# Patient Record
Sex: Female | Born: 1970 | Race: Black or African American | Hispanic: No | Marital: Single | Smoking: Never smoker
Health system: Southern US, Community
[De-identification: ages and names within clinical notes are randomized; demographics above are authoritative.]

## PROBLEM LIST (undated history)

## (undated) ENCOUNTER — Inpatient Hospital Stay (HOSPITAL_COMMUNITY): Payer: Self-pay

## (undated) DIAGNOSIS — R87629 Unspecified abnormal cytological findings in specimens from vagina: Secondary | ICD-10-CM

## (undated) DIAGNOSIS — I10 Essential (primary) hypertension: Secondary | ICD-10-CM

## (undated) DIAGNOSIS — Z9851 Tubal ligation status: Secondary | ICD-10-CM

## (undated) DIAGNOSIS — E0590 Thyrotoxicosis, unspecified without thyrotoxic crisis or storm: Secondary | ICD-10-CM

## (undated) DIAGNOSIS — Z8619 Personal history of other infectious and parasitic diseases: Secondary | ICD-10-CM

## (undated) DIAGNOSIS — B977 Papillomavirus as the cause of diseases classified elsewhere: Secondary | ICD-10-CM

## (undated) HISTORY — PX: HERNIA REPAIR: SHX51

## (undated) HISTORY — DX: Unspecified abnormal cytological findings in specimens from vagina: R87.629

## (undated) HISTORY — DX: Personal history of other infectious and parasitic diseases: Z86.19

## (undated) HISTORY — PX: ABDOMINAL SURGERY: SHX537

## (undated) HISTORY — PX: GYNECOLOGIC CRYOSURGERY: SHX857

---
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
## 1999-03-25 ENCOUNTER — Ambulatory Visit (HOSPITAL_BASED_OUTPATIENT_CLINIC_OR_DEPARTMENT_OTHER): Admission: RE | Admit: 1999-03-25 | Discharge: 1999-03-25 | Payer: Self-pay | Admitting: Orthopedic Surgery

## 1999-09-02 ENCOUNTER — Inpatient Hospital Stay (HOSPITAL_COMMUNITY): Admission: AD | Admit: 1999-09-02 | Discharge: 1999-09-02 | Payer: Self-pay | Admitting: *Deleted

## 2002-01-04 DIAGNOSIS — Z8619 Personal history of other infectious and parasitic diseases: Secondary | ICD-10-CM

## 2002-01-04 HISTORY — DX: Personal history of other infectious and parasitic diseases: Z86.19

## 2002-02-09 ENCOUNTER — Inpatient Hospital Stay (HOSPITAL_COMMUNITY): Admission: AD | Admit: 2002-02-09 | Discharge: 2002-02-09 | Payer: Self-pay | Admitting: Obstetrics

## 2002-05-23 ENCOUNTER — Encounter: Admission: RE | Admit: 2002-05-23 | Discharge: 2002-05-23 | Payer: Self-pay | Admitting: Obstetrics

## 2002-06-10 ENCOUNTER — Inpatient Hospital Stay (HOSPITAL_COMMUNITY): Admission: AD | Admit: 2002-06-10 | Discharge: 2002-06-13 | Payer: Self-pay | Admitting: Obstetrics

## 2002-07-10 ENCOUNTER — Encounter: Payer: Self-pay | Admitting: Emergency Medicine

## 2002-07-10 ENCOUNTER — Emergency Department (HOSPITAL_COMMUNITY): Admission: EM | Admit: 2002-07-10 | Discharge: 2002-07-10 | Payer: Self-pay | Admitting: Emergency Medicine

## 2004-10-12 ENCOUNTER — Encounter (HOSPITAL_COMMUNITY): Admission: RE | Admit: 2004-10-12 | Discharge: 2005-01-10 | Payer: Self-pay | Admitting: Internal Medicine

## 2005-08-27 ENCOUNTER — Emergency Department (HOSPITAL_COMMUNITY): Admission: EM | Admit: 2005-08-27 | Discharge: 2005-08-27 | Payer: Self-pay | Admitting: Family Medicine

## 2006-02-01 ENCOUNTER — Ambulatory Visit (HOSPITAL_COMMUNITY): Admission: RE | Admit: 2006-02-01 | Discharge: 2006-02-01 | Payer: Self-pay | Admitting: Obstetrics

## 2006-04-13 ENCOUNTER — Ambulatory Visit (HOSPITAL_COMMUNITY): Admission: RE | Admit: 2006-04-13 | Discharge: 2006-04-13 | Payer: Self-pay | Admitting: Obstetrics

## 2006-06-06 ENCOUNTER — Inpatient Hospital Stay (HOSPITAL_COMMUNITY): Admission: AD | Admit: 2006-06-06 | Discharge: 2006-06-06 | Payer: Self-pay | Admitting: Obstetrics

## 2006-08-05 ENCOUNTER — Ambulatory Visit (HOSPITAL_COMMUNITY): Admission: RE | Admit: 2006-08-05 | Discharge: 2006-08-05 | Payer: Self-pay | Admitting: Obstetrics

## 2006-08-15 ENCOUNTER — Inpatient Hospital Stay (HOSPITAL_COMMUNITY): Admission: AD | Admit: 2006-08-15 | Discharge: 2006-08-15 | Payer: Self-pay | Admitting: Obstetrics

## 2006-08-26 ENCOUNTER — Inpatient Hospital Stay (HOSPITAL_COMMUNITY): Admission: RE | Admit: 2006-08-26 | Discharge: 2006-08-28 | Payer: Self-pay | Admitting: Obstetrics

## 2008-03-15 ENCOUNTER — Emergency Department (HOSPITAL_COMMUNITY): Admission: EM | Admit: 2008-03-15 | Discharge: 2008-03-15 | Payer: Self-pay | Admitting: Family Medicine

## 2008-10-14 ENCOUNTER — Ambulatory Visit (HOSPITAL_COMMUNITY): Admission: RE | Admit: 2008-10-14 | Discharge: 2008-10-14 | Payer: Self-pay | Admitting: Obstetrics

## 2008-10-25 ENCOUNTER — Ambulatory Visit (HOSPITAL_COMMUNITY): Admission: RE | Admit: 2008-10-25 | Discharge: 2008-10-25 | Payer: Self-pay | Admitting: Obstetrics

## 2010-01-25 ENCOUNTER — Encounter: Payer: Self-pay | Admitting: Obstetrics

## 2010-05-22 NOTE — Op Note (Signed)
Chelsea Colon. [facility]  Patient:    Chelsea, Colon                             MRN: [id number] Proc. Date: 03/25/99 Adm. Date:  [id number] Attending:  Milly Jakob                           Operative Report  PREOPERATIVE DIAGNOSIS:  Hammer toes, second and third toe with fairly significant flexion deformity at the distal interphalangeal joint as well.  She does not have significant deformity at the metatarsal phalangeal joint.  POSTOPERATIVE DIAGNOSIS:  Hammer toes, second and third toe with fairly significant flexion deformity at the distal interphalangeal joint as well.  She does not have significant deformity at the metatarsal phalangeal joint.  OPERATION:  Hammer toe correction with distal interphalangeal joint fusion. Tenotomy of the long flexor tendon.  SURGEON:  Harvie Junior, M.D.  ASSISTANT:  Kerby Less, P.A.  ANESTHESIA:  General  BRIEF HISTORY:  She is a 40 year old female with a long history of having had pain in her foot and then down into her second and third toes. She has had conservative treatment for a lengthy period of time with no resolution of her symptoms including anti-inflammatories and shoe modification.  She had had relatives that had this  same procedure and were very satisfied with surgical correction and was desirous of having this done.  Because of this she was taken to the operating room for this  procedure.  DESCRIPTION OF PROCEDURE:  The patient was taken to the operating room and after adequate anesthesia was obtained with a general anesthetic, the patient was placed supine on the operating table.  The right leg was prepped and draped in the usual sterile fashion.  Following Esmarch exsanguination of the extremity, tourniquet was inflated to 250 mmHg.  Following this an incision was made over the PIP of the second toe.  The elliptical incision was made to prepare for the shortening of he toe.  At  this point, the extensor tendon was identified and cut.  The collateral ligaments were cut.  The volar plate was identified and released on the inferior aspect of the toe.  Following this, the attention was turned towards the long flexor tendon.  The flexor brevis was identified and the two limbs were retracted. The flexor longus was then identified below it and this was tenotomized at this  area. This allowed the DIP joint to come up quite nicely.  At this point, attention was turned towards the PIP joint where a rongeur was used to make a parallel resection to expose cancellus bone.  A similar procedure was undertaken over the proximal portion of the middle phalanx.  Once the bony surfaces were denuded, the guide wire from the bioabsorbable pin was put in place and it was measured. The bioabsorbable pin was then put in the proximal phalanx and the distal phalanx was stretched distally and then set back on the pin.  Excellent stability was achieved at the PIP joint.  Fluoroscopy was used to check the placement and was found to be appropriate.  Following this, the wound was copiously irrigated and suctioned dry. A horizontal mattress suture was used repairing the extensor tendon and then the remaining skin was closed with nylon interrupted sutures.  Following this a similar procedure was performed on the third toe.  At this  point, the patient had a sterile compressive dressing applied and she was taken to the recovery room.  She was noted to be in satisfactory condition.  Estimated blood loss for the procedure was none. DD:  03/25/99 TD:  03/26/99 Job: [id number]

## 2010-06-12 ENCOUNTER — Emergency Department (HOSPITAL_COMMUNITY)
Admission: EM | Admit: 2010-06-12 | Discharge: 2010-06-12 | Disposition: A | Discharge status: Home / Self Care | Payer: Worker's Compensation | Attending: Emergency Medicine | Admitting: Emergency Medicine

## 2010-06-12 ENCOUNTER — Emergency Department (HOSPITAL_COMMUNITY): Payer: Worker's Compensation

## 2010-06-12 DIAGNOSIS — R51 Headache: Secondary | ICD-10-CM | POA: Insufficient documentation

## 2010-06-12 DIAGNOSIS — S139XXA Sprain of joints and ligaments of unspecified parts of neck, initial encounter: Secondary | ICD-10-CM | POA: Insufficient documentation

## 2010-06-12 DIAGNOSIS — R209 Unspecified disturbances of skin sensation: Secondary | ICD-10-CM | POA: Insufficient documentation

## 2010-06-12 DIAGNOSIS — Y92410 Unspecified street and highway as the place of occurrence of the external cause: Secondary | ICD-10-CM | POA: Insufficient documentation

## 2010-07-07 ENCOUNTER — Ambulatory Visit: Payer: Self-pay

## 2010-07-07 ENCOUNTER — Other Ambulatory Visit: Payer: Self-pay | Admitting: Occupational Medicine

## 2010-07-07 DIAGNOSIS — R52 Pain, unspecified: Secondary | ICD-10-CM

## 2010-07-09 ENCOUNTER — Other Ambulatory Visit: Payer: Self-pay | Admitting: Occupational Medicine

## 2010-07-09 ENCOUNTER — Ambulatory Visit: Payer: Self-pay

## 2010-07-09 DIAGNOSIS — R52 Pain, unspecified: Secondary | ICD-10-CM

## 2010-10-16 LAB — CBC
HCT: 28.7 — ABNORMAL LOW
HCT: 34.2 — ABNORMAL LOW
Hemoglobin: 11.4 — ABNORMAL LOW
Hemoglobin: 9.7 — ABNORMAL LOW
MCHC: 33.3
MCHC: 33.7
MCV: 78.1
MCV: 79
Platelets: 312
Platelets: 396
RBC: 3.64 — ABNORMAL LOW
RBC: 4.37
RDW: 15.1 — ABNORMAL HIGH
RDW: 15.4 — ABNORMAL HIGH
WBC: 12.6 — ABNORMAL HIGH
WBC: 14.7 — ABNORMAL HIGH

## 2010-10-16 LAB — RPR: RPR Ser Ql: NONREACTIVE

## 2010-10-22 LAB — URINALYSIS, ROUTINE W REFLEX MICROSCOPIC
Bilirubin Urine: NEGATIVE
Glucose, UA: 100 — AB
Ketones, ur: 15 — AB
Leukocytes, UA: NEGATIVE
Nitrite: NEGATIVE
Protein, ur: NEGATIVE
Specific Gravity, Urine: >1.03 — ABNORMAL HIGH
Urobilinogen, UA: 0.2
pH: 6

## 2010-10-22 LAB — URINE MICROSCOPIC-ADD ON

## 2011-03-23 ENCOUNTER — Emergency Department (HOSPITAL_COMMUNITY)
Admission: EM | Admit: 2011-03-23 | Discharge: 2011-03-23 | Disposition: A | Discharge status: Home / Self Care | Payer: BC Managed Care – PPO | Attending: Emergency Medicine | Admitting: Emergency Medicine

## 2011-03-23 ENCOUNTER — Encounter (HOSPITAL_COMMUNITY): Payer: Self-pay | Admitting: Emergency Medicine

## 2011-03-23 DIAGNOSIS — K529 Noninfective gastroenteritis and colitis, unspecified: Secondary | ICD-10-CM

## 2011-03-23 DIAGNOSIS — R000 Tachycardia, unspecified: Secondary | ICD-10-CM | POA: Insufficient documentation

## 2011-03-23 DIAGNOSIS — K5289 Other specified noninfective gastroenteritis and colitis: Secondary | ICD-10-CM | POA: Insufficient documentation

## 2011-03-23 LAB — DIFFERENTIAL
Basophils Absolute: 0 10*3/uL (ref 0.0–0.1)
Basophils Relative: 0 % (ref 0–1)
Eosinophils Absolute: 0 10*3/uL (ref 0.0–0.7)
Eosinophils Relative: 1 % (ref 0–5)
Lymphs Abs: 1.7 10*3/uL (ref 0.7–4.0)
Monocytes Absolute: 0.5 10*3/uL (ref 0.1–1.0)
Monocytes Relative: 13 % — ABNORMAL HIGH (ref 3–12)
Neutro Abs: 2 10*3/uL (ref 1.7–7.7)
Neutrophils Relative %: 46 % (ref 43–77)

## 2011-03-23 LAB — BASIC METABOLIC PANEL
BUN: 14 mg/dL (ref 6–23)
CO2: 27 mEq/L (ref 19–32)
Calcium: 9.8 mg/dL (ref 8.4–10.5)
Chloride: 102 mEq/L (ref 96–112)
Creatinine, Ser: 0.37 mg/dL — ABNORMAL LOW (ref 0.50–1.10)
Glucose, Bld: 98 mg/dL (ref 70–99)
Potassium: 4.1 mEq/L (ref 3.5–5.1)
Sodium: 138 mEq/L (ref 135–145)

## 2011-03-23 LAB — CBC
HCT: 38.9 % (ref 36.0–46.0)
Hemoglobin: 12.5 g/dL (ref 12.0–15.0)
MCH: 24 pg — ABNORMAL LOW (ref 26.0–34.0)
MCHC: 32.1 g/dL (ref 30.0–36.0)
MCV: 74.7 fL — ABNORMAL LOW (ref 78.0–100.0)
RBC: 5.21 MIL/uL — ABNORMAL HIGH (ref 3.87–5.11)
RDW: 12.9 % (ref 11.5–15.5)
WBC: 4.2 10*3/uL (ref 4.0–10.5)

## 2011-03-23 MED ORDER — PROMETHAZINE HCL 25 MG PO TABS: 25.0000 mg | ORAL_TABLET | Freq: Four times a day (QID) | ORAL | Status: AC | PRN

## 2011-03-23 MED ORDER — ONDANSETRON HCL 4 MG/2ML IJ SOLN
4.0000 mg | Freq: Once | INTRAMUSCULAR | Status: AC
  Administered 2011-03-23: 4 mg via INTRAVENOUS
  Filled 2011-03-23: qty 2

## 2011-03-23 MED ORDER — LOPERAMIDE HCL 2 MG PO CAPS: 2.0000 mg | ORAL_CAPSULE | Freq: Four times a day (QID) | ORAL | Status: AC | PRN

## 2011-03-23 MED ORDER — SODIUM CHLORIDE 0.9 % IV BOLUS (SEPSIS)
1000.0000 mL | Freq: Once | INTRAVENOUS | Status: AC
  Administered 2011-03-23: 1000 mL via INTRAVENOUS

## 2011-03-23 NOTE — ED Provider Notes (Signed)
History     CSN: [id number]  Arrival date & time 03/23/11  1223   First MD Initiated Contact with Patient 03/23/11 1525      Chief Complaint  Patient presents with  . Emesis    (Consider location/radiation/quality/duration/timing/severity/associated sxs/prior treatment) Patient is a 41 y.o. female presenting with vomiting. The history is provided by the patient.  Emesis  Associated symptoms include diarrhea. Pertinent negatives include no abdominal pain, no chills, no fever and no headaches.   the patient is a 41 year old, female, who presents to the emergency department complaining of full, nausea, vomiting, diarrhea.  He stopped and antibiotics for a sinus infection.  She denies pain anywhere.  He denies fevers, chills and she says that there is a small amount of blood "in her emesis.  She has not been exposed to anybody with similar symptoms.  She denies a history of peptic ulcer disease.  She was seen by her family doctor, who noted that she had pulsatile mass, and so she was sent here for further evaluation.  History reviewed. No pertinent past medical history.  Past Surgical History  Procedure Date  . Cesarean section   . Abdominal surgery     No family history on file.  History  Substance Use Topics  . Smoking status: Never Smoker   . Smokeless tobacco: Not on file  . Alcohol Use: No    OB History    Grav Para Term Preterm Abortions TAB SAB Ect Mult Living                  Review of Systems  Constitutional: Negative for fever and chills.  Cardiovascular: Negative for chest pain.  Gastrointestinal: Positive for nausea, vomiting and diarrhea. Negative for abdominal pain and blood in stool.  Genitourinary: Negative for dysuria and hematuria.  Neurological: Negative for headaches.  Psychiatric/Behavioral: Negative for confusion.  All other systems reviewed and are negative.    Allergies  Review of patient's allergies indicates no known allergies.  Home  Medications   Current Outpatient Rx  Name Route Sig Dispense Refill  . IBUPROFEN 200 MG PO TABS Oral Take 400-800 mg by mouth every 6 (six) hours as needed. For pain and headache      BP 136/73  Pulse 108  Temp(Src) 98 F (36.7 C) (Oral)  Resp 20  SpO2 99%  Physical Exam  Vitals reviewed. Constitutional: She is oriented to person, place, and time. She appears well-developed and well-nourished.  HENT:  Head: Normocephalic and atraumatic.  Eyes: Pupils are equal, round, and reactive to light.  Neck: Normal range of motion.  Cardiovascular: Regular rhythm and normal heart sounds.   No murmur heard.      Tachycardia  Pulmonary/Chest: Effort normal and breath sounds normal. No respiratory distress. She has no wheezes. She has no rales.  Abdominal: Soft. She exhibits no distension and no mass. There is tenderness. There is no rebound and no guarding.       Pulsatile Mass in abdomen.  No bruit  Musculoskeletal: Normal range of motion. She exhibits no edema and no tenderness.  Neurological: She is alert and oriented to person, place, and time. No cranial nerve deficit.  Skin: Skin is warm and dry. No rash noted. No erythema.  Psychiatric: She has a normal mood and affect. Her behavior is normal.    ED Course  Procedures (including critical care time) 41 year old, female, with history of gastroenteritis symptoms, presents with tachycardia, to 2, uncontrolled emesis, diarrhea.  She does  have a tender palpable mass, which is pulsatile in her abdomen.  Bedside ultrasound was performed.  Aorta is less  than 2 cm at its widest diameter .  The proximal abdominal aorta.  All the way to the bifurcation.  We'll establish an IV and treat her symptomatically and perform laboratory testing for further evaluation to   Labs Reviewed  CBC  DIFFERENTIAL  BASIC METABOLIC PANEL   No results found.   No diagnosis found.  4:47 PM I L IVF completed. She wants to go home.   MDM  Gastroenteritis  resulting in tachycardia Tachycardia resolved        Cheri Guppy, MD 03/23/11 [phone]

## 2011-03-23 NOTE — ED Notes (Signed)
Has had a cold and then started to vomit saw her dr today and told him this has been sent for further etsts

## 2011-03-23 NOTE — Discharge Instructions (Signed)
Your blood tests do not show any significant illness.  Use Phenergan for nausea and vomiting, and Imodium for diarrhea.  Followup with your Dr. as needed.  He can tell him that an ultrasound of your aorta shows that he is not dilated.  Return for worse or uncontrolled symptoms

## 2011-05-20 ENCOUNTER — Other Ambulatory Visit: Payer: Self-pay | Admitting: Internal Medicine

## 2011-05-20 ENCOUNTER — Other Ambulatory Visit: Payer: Self-pay | Admitting: Obstetrics

## 2011-05-20 DIAGNOSIS — Z1231 Encounter for screening mammogram for malignant neoplasm of breast: Secondary | ICD-10-CM

## 2011-06-04 ENCOUNTER — Ambulatory Visit: Payer: Self-pay

## 2012-05-09 ENCOUNTER — Ambulatory Visit: Payer: Self-pay

## 2012-05-11 ENCOUNTER — Ambulatory Visit
Admission: RE | Admit: 2012-05-11 | Discharge: 2012-05-11 | Disposition: A | Discharge status: Home / Self Care | Payer: BC Managed Care – PPO | Source: Ambulatory Visit | Attending: Internal Medicine | Admitting: Internal Medicine

## 2012-05-11 DIAGNOSIS — Z1231 Encounter for screening mammogram for malignant neoplasm of breast: Secondary | ICD-10-CM

## 2013-09-26 ENCOUNTER — Other Ambulatory Visit: Payer: Self-pay

## 2013-09-26 DIAGNOSIS — Z1231 Encounter for screening mammogram for malignant neoplasm of breast: Secondary | ICD-10-CM

## 2013-10-05 ENCOUNTER — Ambulatory Visit: Payer: Self-pay

## 2013-10-12 ENCOUNTER — Ambulatory Visit: Payer: Self-pay

## 2013-10-18 ENCOUNTER — Ambulatory Visit: Payer: Self-pay

## 2013-11-05 ENCOUNTER — Ambulatory Visit
Admission: RE | Admit: 2013-11-05 | Discharge: 2013-11-05 | Disposition: A | Discharge status: Home / Self Care | Payer: BC Managed Care – PPO | Source: Ambulatory Visit

## 2013-11-05 DIAGNOSIS — Z1231 Encounter for screening mammogram for malignant neoplasm of breast: Secondary | ICD-10-CM

## 2014-01-04 NOTE — L&D Delivery Note (Signed)
Delivery Note At 2:13 AM a viable female was delivered via  (Presentation: ;  ).  APGAR: , ; weight  .   Placenta status: , .  Cord:  with the following complications: .  Cord pH: not done  Anesthesia: Epidural  Episiotomy:   Lacerations:   Suture Repair: 2.0 Est. Blood Loss (mL):    Mom to postpartum.  Baby to Couplet care / Skin to Skin.  Satoria Dunlop A 11/23/2014, 2:36 AM

## 2014-04-24 ENCOUNTER — Ambulatory Visit (HOSPITAL_COMMUNITY)
Admission: RE | Admit: 2014-04-24 | Discharge: 2014-04-24 | Disposition: A | Discharge status: Home / Self Care | Payer: BC Managed Care – PPO | Source: Ambulatory Visit | Attending: Obstetrics | Admitting: Obstetrics

## 2014-04-24 ENCOUNTER — Other Ambulatory Visit (HOSPITAL_COMMUNITY): Payer: Self-pay | Admitting: Obstetrics

## 2014-04-24 DIAGNOSIS — Z3689 Encounter for other specified antenatal screening: Secondary | ICD-10-CM

## 2014-04-24 DIAGNOSIS — O26851 Spotting complicating pregnancy, first trimester: Secondary | ICD-10-CM | POA: Diagnosis not present

## 2014-04-24 DIAGNOSIS — Z3401 Encounter for supervision of normal first pregnancy, first trimester: Secondary | ICD-10-CM

## 2014-04-24 DIAGNOSIS — Z3A08 8 weeks gestation of pregnancy: Secondary | ICD-10-CM | POA: Insufficient documentation

## 2014-04-24 DIAGNOSIS — Z36 Encounter for antenatal screening of mother: Secondary | ICD-10-CM | POA: Insufficient documentation

## 2014-04-24 LAB — OB RESULTS CONSOLE RPR: RPR: NONREACTIVE

## 2014-04-24 LAB — OB RESULTS CONSOLE HIV ANTIBODY (ROUTINE TESTING): HIV: NONREACTIVE

## 2014-04-24 LAB — OB RESULTS CONSOLE RUBELLA ANTIBODY, IGM: RUBELLA: NON-IMMUNE/NOT IMMUNE

## 2014-04-24 LAB — OB RESULTS CONSOLE ABO/RH: RH TYPE: POSITIVE

## 2014-04-24 LAB — OB RESULTS CONSOLE HEPATITIS B SURFACE ANTIGEN: Hepatitis B Surface Ag: NEGATIVE

## 2014-04-24 LAB — OB RESULTS CONSOLE ANTIBODY SCREEN: Antibody Screen: NEGATIVE

## 2014-06-25 ENCOUNTER — Other Ambulatory Visit (HOSPITAL_COMMUNITY): Payer: Self-pay | Admitting: Obstetrics

## 2014-06-25 DIAGNOSIS — Z0489 Encounter for examination and observation for other specified reasons: Secondary | ICD-10-CM

## 2014-06-25 DIAGNOSIS — IMO0002 Reserved for concepts with insufficient information to code with codable children: Secondary | ICD-10-CM

## 2014-06-25 DIAGNOSIS — O09522 Supervision of elderly multigravida, second trimester: Secondary | ICD-10-CM

## 2014-07-05 ENCOUNTER — Ambulatory Visit (HOSPITAL_COMMUNITY)
Admission: RE | Admit: 2014-07-05 | Discharge: 2014-07-05 | Disposition: A | Discharge status: Home / Self Care | Payer: BC Managed Care – PPO | Source: Ambulatory Visit | Attending: Obstetrics | Admitting: Obstetrics

## 2014-07-05 ENCOUNTER — Encounter (HOSPITAL_COMMUNITY): Payer: Self-pay

## 2014-07-05 ENCOUNTER — Other Ambulatory Visit (HOSPITAL_COMMUNITY): Payer: Self-pay | Admitting: Obstetrics

## 2014-07-05 DIAGNOSIS — O283 Abnormal ultrasonic finding on antenatal screening of mother: Secondary | ICD-10-CM | POA: Insufficient documentation

## 2014-07-05 DIAGNOSIS — O3421 Maternal care for scar from previous cesarean delivery: Secondary | ICD-10-CM | POA: Diagnosis not present

## 2014-07-05 DIAGNOSIS — O09522 Supervision of elderly multigravida, second trimester: Secondary | ICD-10-CM | POA: Diagnosis not present

## 2014-07-05 DIAGNOSIS — Z3A18 18 weeks gestation of pregnancy: Secondary | ICD-10-CM | POA: Insufficient documentation

## 2014-07-05 DIAGNOSIS — Z3689 Encounter for other specified antenatal screening: Secondary | ICD-10-CM | POA: Insufficient documentation

## 2014-07-05 DIAGNOSIS — Z0489 Encounter for examination and observation for other specified reasons: Secondary | ICD-10-CM

## 2014-07-05 DIAGNOSIS — O99282 Endocrine, nutritional and metabolic diseases complicating pregnancy, second trimester: Secondary | ICD-10-CM | POA: Insufficient documentation

## 2014-07-05 DIAGNOSIS — Z36 Encounter for antenatal screening of mother: Secondary | ICD-10-CM | POA: Diagnosis present

## 2014-07-05 DIAGNOSIS — O10912 Unspecified pre-existing hypertension complicating pregnancy, second trimester: Secondary | ICD-10-CM | POA: Diagnosis not present

## 2014-07-05 DIAGNOSIS — E0590 Thyrotoxicosis, unspecified without thyrotoxic crisis or storm: Secondary | ICD-10-CM | POA: Insufficient documentation

## 2014-07-05 DIAGNOSIS — IMO0002 Reserved for concepts with insufficient information to code with codable children: Secondary | ICD-10-CM

## 2014-07-05 DIAGNOSIS — O09529 Supervision of elderly multigravida, unspecified trimester: Secondary | ICD-10-CM | POA: Insufficient documentation

## 2014-07-05 HISTORY — DX: Thyrotoxicosis, unspecified without thyrotoxic crisis or storm: E05.90

## 2014-07-05 HISTORY — DX: Essential (primary) hypertension: I10

## 2014-07-09 NOTE — Progress Notes (Signed)
Genetic Counseling  High-Risk Gestation Note  Appointment Date:  07/05/14 Referred By: Kathreen Cosier, MD Date of Birth:  [birth year] Partner:  Chelsea Colon   Pregnancy History: [id number] Estimated Date of Delivery: 11/30/14 Estimated Gestational Age: [redacted]w[redacted]d Attending: Rema Fendt, MD   [name] and her husband, Mr. Chelsea Colon, were seen for genetic counseling because of a maternal age of 44 y.o.. She was also accompanied by her daughter to today's visit.     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   We reviewed chromosomes, nondisjunction, and the associated 1 in 46 risk for fetal aneuploidy related to a maternal age of 44 y.o. at [redacted]w[redacted]d gestation.  They were counseled that the risk for aneuploidy decreases as gestational age increases, accounting for those pregnancies which spontaneously abort.  We specifically discussed Down syndrome (trisomy 78), trisomies 40 and 59, and sex chromosome aneuploidies (47,XXX and 47,XXY) including the common features and prognoses of each.   We reviewed results of detailed ultrasound performed today, which visualized absent fetal nasal bone. Remaining visualized fetal anatomy appeared normal. Complete ultrasound results reported separately. We discussed that the second trimester genetic sonogram is targeted at identifying features associated with aneuploidy.  It has evolved as a screening tool used to provide an individualized risk assessment for Down syndrome and  other trisomies.  The ability of sonography to aid in the detection of aneuploidies relies on identification of both major structural anomalies and "soft markers."   The couple was counseled that an absent nasal bone is a highly sensitive and specific marker for Down syndrome.  It is present in approximately 1% of chromosomally normal fetuses, but up to 70% of fetuses with Down syndrome, 55 % with trisomy 18, and 34% with trisomy 13. The length of the fetal nasal bone varies by race and ethnicity in second trimester fetuses.  The finding has a likelihood ratio of approximately 8.5 in the African American population.  Given Ms. Carton maternal age, we discussed the approximate 1 in 84 risk for fetal Down syndrome.  Considering the ultrasound finding of absent fetal nasal bone, the adjusted risk for fetal Down syndrome is ~1 in 3.3 (30%).  We reviewed available screening options including Quad screen, noninvasive prenatal screening (NIPS)/cell free DNA (cfDNA) testing, and detailed ultrasound.  They were counseled that screening tests are used to modify a patient's a priori risk for aneuploidy, typically based on age. This estimate provides a pregnancy specific risk assessment. We reviewed the benefits and limitations of each option. Specifically, we discussed the conditions for which each test screens, the detection rates, and false positive rates of each. They were also counseled regarding diagnostic testing via amniocentesis. We reviewed the approximate 1 in 300-500 risk for complications for amniocentesis, including spontaneous pregnancy loss. After consideration of all the options, she elected to proceed with NIPS (Panorama) and declined amniocentesis in the pregnancy.  Those results will be available in 8-10 days.  They understand that screening tests cannot rule out all birth defects or genetic syndromes. The patient was advised of this limitation and states  she still does not want additional testing at this  time.   Chelsea Colon was provided with written information regarding sickle cell anemia (SCA) including the carrier frequency and incidence in the African-American population, the availability of carrier testing and prenatal diagnosis if indicated.  In addition, we discussed that hemoglobinopathies are routinely screened for as part of the [place] newborn screening panel. The father of the pregnancy and the couple's daughter reportedly both have sickle cell trait. The patient reported that she does not have sickle cell trait, to the best of her knowledge. We do not have medical documentation of this result. If the patient does not have a hemoglobin variant, then the pregnancy would not be expected to be at increased risk for sickle cell disease, but would have a 1 in 2 (50%) chance for sickle cell trait. Screening for hemoglobin S and additional less common variants, such as hemoglobin C, is available to the patient, if desired.  She declined hemoglobin electrophoresis today.  Both family histories were reviewed and found to be otherwise noncontributory for birth defects, intellectual disability, and known genetic conditions. Without further information regarding the provided family history, an accurate genetic risk cannot be calculated. Further genetic counseling is warranted if more information is obtained.  Chelsea Colon denied exposure to environmental toxins or chemical agents. She denied the use of alcohol, tobacco or street drugs. She denied significant viral illnesses during the course of her pregnancy. Her medical and surgical histories were contributory for hyperthyroidism, for which she is treated with methimazole.   I counseled this couple regarding the above risks and available options.  The approximate face-to-face time with the genetic counselor was 40 minutes.  Chelsea PlowmanKaren Donyea Gafford, MS,  Certified Genetic Counselor 07/09/2014

## 2014-07-11 ENCOUNTER — Telehealth (HOSPITAL_COMMUNITY): Payer: Self-pay | Admitting: MS"

## 2014-07-11 NOTE — Telephone Encounter (Signed)
Called Greenlee Nedra HaiLee to discuss her cell free DNA test results.  Ms. Raynelle DickStar Das had Panorama testing through [facility].  Testing was offered because of advanced maternal age and absent nasal bone on ultrasound.   The patient was identified by name and DOB.  We reviewed that these are within normal limits, showing a less than 1 in 10,000 risk for trisomies 21, 18 and 13, and monosomy X (Turner syndrome).  In addition, the risk for triploidy/vanishing twin and sex chromosome trisomies (47,XXX and 47,XXY) was also low risk. We reviewed that this testing identifies > 99% of pregnancies with trisomy 6221, trisomy 1513, sex chromosome trisomies (47,XXX and 47,XXY), and triploidy. The detection rate for trisomy 18 is 96%.  The detection rate for monosomy X is ~92%.  The false positive rate is <0.1% for all conditions. Testing was also consistent with female fetal sex.  She understands that this testing does not identify all genetic conditions.  All questions were answered to her satisfaction, she was encouraged to call with additional questions or concerns.  Quinn PlowmanKaren Connery Shiffler, MS Certified Genetic Counselor 07/11/2014 4:23 PM

## 2014-07-16 ENCOUNTER — Other Ambulatory Visit (HOSPITAL_COMMUNITY): Payer: Self-pay | Admitting: Obstetrics

## 2014-08-02 ENCOUNTER — Ambulatory Visit (HOSPITAL_COMMUNITY)
Admission: RE | Admit: 2014-08-02 | Discharge: 2014-08-02 | Disposition: A | Discharge status: Home / Self Care | Payer: BC Managed Care – PPO | Source: Ambulatory Visit | Attending: Obstetrics | Admitting: Obstetrics

## 2014-08-02 ENCOUNTER — Encounter (HOSPITAL_COMMUNITY): Payer: Self-pay

## 2014-08-02 VITALS — BP 128/63 | HR 87 | Wt 193.0 lb

## 2014-08-02 DIAGNOSIS — O09522 Supervision of elderly multigravida, second trimester: Secondary | ICD-10-CM | POA: Insufficient documentation

## 2014-08-02 DIAGNOSIS — O283 Abnormal ultrasonic finding on antenatal screening of mother: Secondary | ICD-10-CM | POA: Insufficient documentation

## 2014-08-02 DIAGNOSIS — Z3A22 22 weeks gestation of pregnancy: Secondary | ICD-10-CM | POA: Insufficient documentation

## 2014-09-13 ENCOUNTER — Encounter (HOSPITAL_COMMUNITY): Payer: Self-pay

## 2014-09-13 ENCOUNTER — Other Ambulatory Visit (HOSPITAL_COMMUNITY): Payer: Self-pay | Admitting: Maternal and Fetal Medicine

## 2014-09-13 ENCOUNTER — Ambulatory Visit (HOSPITAL_COMMUNITY)
Admission: RE | Admit: 2014-09-13 | Discharge: 2014-09-13 | Disposition: A | Discharge status: Home / Self Care | Payer: BC Managed Care – PPO | Source: Ambulatory Visit | Attending: Obstetrics | Admitting: Obstetrics

## 2014-09-13 DIAGNOSIS — O3421 Maternal care for scar from previous cesarean delivery: Secondary | ICD-10-CM | POA: Diagnosis not present

## 2014-09-13 DIAGNOSIS — O99283 Endocrine, nutritional and metabolic diseases complicating pregnancy, third trimester: Secondary | ICD-10-CM | POA: Insufficient documentation

## 2014-09-13 DIAGNOSIS — O283 Abnormal ultrasonic finding on antenatal screening of mother: Secondary | ICD-10-CM

## 2014-09-13 DIAGNOSIS — O09522 Supervision of elderly multigravida, second trimester: Secondary | ICD-10-CM

## 2014-09-13 DIAGNOSIS — O09523 Supervision of elderly multigravida, third trimester: Secondary | ICD-10-CM

## 2014-09-13 DIAGNOSIS — O10913 Unspecified pre-existing hypertension complicating pregnancy, third trimester: Secondary | ICD-10-CM

## 2014-09-13 DIAGNOSIS — O34219 Maternal care for unspecified type scar from previous cesarean delivery: Secondary | ICD-10-CM

## 2014-09-13 DIAGNOSIS — E0590 Thyrotoxicosis, unspecified without thyrotoxic crisis or storm: Secondary | ICD-10-CM

## 2014-09-18 ENCOUNTER — Encounter (HOSPITAL_COMMUNITY): Payer: Self-pay

## 2014-09-18 ENCOUNTER — Inpatient Hospital Stay (HOSPITAL_COMMUNITY): Payer: No Typology Code available for payment source

## 2014-09-18 ENCOUNTER — Inpatient Hospital Stay (HOSPITAL_COMMUNITY)
Admission: AD | Admit: 2014-09-18 | Discharge: 2014-09-18 | Disposition: A | Discharge status: Home / Self Care | Payer: No Typology Code available for payment source | Source: Ambulatory Visit | Attending: Emergency Medicine | Admitting: Emergency Medicine

## 2014-09-18 DIAGNOSIS — Z3A29 29 weeks gestation of pregnancy: Secondary | ICD-10-CM | POA: Insufficient documentation

## 2014-09-18 DIAGNOSIS — S0990XA Unspecified injury of head, initial encounter: Secondary | ICD-10-CM

## 2014-09-18 DIAGNOSIS — O26893 Other specified pregnancy related conditions, third trimester: Secondary | ICD-10-CM | POA: Diagnosis not present

## 2014-09-18 DIAGNOSIS — Z3490 Encounter for supervision of normal pregnancy, unspecified, unspecified trimester: Secondary | ICD-10-CM

## 2014-09-18 DIAGNOSIS — R109 Unspecified abdominal pain: Secondary | ICD-10-CM | POA: Insufficient documentation

## 2014-09-18 DIAGNOSIS — S161XXA Strain of muscle, fascia and tendon at neck level, initial encounter: Secondary | ICD-10-CM

## 2014-09-18 LAB — CBC WITH DIFFERENTIAL/PLATELET
BASOS ABS: 0 10*3/uL (ref 0.0–0.1)
BASOS PCT: 0 %
Eosinophils Absolute: 0.2 10*3/uL (ref 0.0–0.7)
Eosinophils Relative: 2 %
HEMATOCRIT: 26.8 % — AB (ref 36.0–46.0)
HEMOGLOBIN: 8.6 g/dL — AB (ref 12.0–15.0)
Lymphocytes Relative: 16 %
Lymphs Abs: 1.5 10*3/uL (ref 0.7–4.0)
MCH: 25.8 pg — ABNORMAL LOW (ref 26.0–34.0)
MCHC: 32.1 g/dL (ref 30.0–36.0)
MCV: 80.5 fL (ref 78.0–100.0)
MONOS PCT: 6 %
Monocytes Absolute: 0.5 10*3/uL (ref 0.1–1.0)
NEUTROS ABS: 7.1 10*3/uL (ref 1.7–7.7)
NEUTROS PCT: 76 %
Platelets: 286 10*3/uL (ref 150–400)
RBC: 3.33 MIL/uL — ABNORMAL LOW (ref 3.87–5.11)
RDW: 13.7 % (ref 11.5–15.5)
WBC: 9.2 10*3/uL (ref 4.0–10.5)

## 2014-09-18 LAB — BASIC METABOLIC PANEL
ANION GAP: 9 (ref 5–15)
BUN: 7 mg/dL (ref 6–20)
CHLORIDE: 108 mmol/L (ref 101–111)
CO2: 18 mmol/L — AB (ref 22–32)
Calcium: 8.1 mg/dL — ABNORMAL LOW (ref 8.9–10.3)
Creatinine, Ser: 0.58 mg/dL (ref 0.44–1.00)
GFR calc non Af Amer: >60 mL/min (ref >60)
Glucose, Bld: 67 mg/dL (ref 65–99)
Potassium: 3.6 mmol/L (ref 3.5–5.1)
Sodium: 135 mmol/L (ref 135–145)

## 2014-09-18 MED ORDER — SODIUM CHLORIDE 0.9 % IV BOLUS (SEPSIS)
1000.0000 mL | Freq: Once | INTRAVENOUS | Status: AC
  Administered 2014-09-18: 1000 mL via INTRAVENOUS

## 2014-09-18 MED ORDER — SODIUM CHLORIDE 0.9 % IV SOLN
INTRAVENOUS | Status: DC
  Administered 2014-09-18: 18:00:00 via INTRAVENOUS

## 2014-09-18 MED ORDER — ONDANSETRON HCL 4 MG/2ML IJ SOLN
4.0000 mg | Freq: Once | INTRAMUSCULAR | Status: AC
  Administered 2014-09-18: 4 mg via INTRAVENOUS
  Filled 2014-09-18: qty 2

## 2014-09-18 MED ORDER — TERBUTALINE SULFATE 1 MG/ML IJ SOLN
0.2500 mg | Freq: Once | INTRAMUSCULAR | Status: AC
  Administered 2014-09-18: 0.25 mg via SUBCUTANEOUS
  Filled 2014-09-18: qty 1

## 2014-09-18 NOTE — Discharge Instructions (Signed)
Expect to be sore and stiff for the next few days. For any concerns for contractions contact her OB doctor. Would also recommend following up with your office in the morning at least by phone. For any other abdominal pain or other complaints of follow-up here. Okay to take Tylenol. Work note provided to be out of work for the next couple days. Head CT was negative CT of the neck was negative. Monitoring done by [facility] nurse without any immediate concerns.

## 2014-09-18 NOTE — ED Notes (Signed)
Pt in CT.
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
## 2014-09-18 NOTE — ED Notes (Signed)
Rapid OB nurse at bedside.  Pt on fetal monitor.
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
## 2014-09-18 NOTE — ED Notes (Signed)
Notified OB Rapid Response 
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
## 2014-10-03 ENCOUNTER — Inpatient Hospital Stay (HOSPITAL_COMMUNITY)
Admission: AD | Admit: 2014-10-03 | Discharge: 2014-10-03 | Disposition: A | Discharge status: Home / Self Care | Payer: BC Managed Care – PPO | Source: Ambulatory Visit | Attending: Obstetrics | Admitting: Obstetrics

## 2014-10-03 ENCOUNTER — Encounter (HOSPITAL_COMMUNITY): Payer: Self-pay | Admitting: *Deleted

## 2014-10-03 DIAGNOSIS — O2343 Unspecified infection of urinary tract in pregnancy, third trimester: Secondary | ICD-10-CM | POA: Diagnosis not present

## 2014-10-03 DIAGNOSIS — R030 Elevated blood-pressure reading, without diagnosis of hypertension: Secondary | ICD-10-CM | POA: Diagnosis not present

## 2014-10-03 DIAGNOSIS — O26893 Other specified pregnancy related conditions, third trimester: Secondary | ICD-10-CM | POA: Diagnosis not present

## 2014-10-03 DIAGNOSIS — Z3A31 31 weeks gestation of pregnancy: Secondary | ICD-10-CM | POA: Diagnosis not present

## 2014-10-03 DIAGNOSIS — O133 Gestational [pregnancy-induced] hypertension without significant proteinuria, third trimester: Secondary | ICD-10-CM | POA: Diagnosis not present

## 2014-10-03 DIAGNOSIS — I10 Essential (primary) hypertension: Secondary | ICD-10-CM | POA: Diagnosis present

## 2014-10-03 DIAGNOSIS — IMO0001 Reserved for inherently not codable concepts without codable children: Secondary | ICD-10-CM

## 2014-10-03 LAB — URINE MICROSCOPIC-ADD ON

## 2014-10-03 LAB — URINALYSIS, ROUTINE W REFLEX MICROSCOPIC
BILIRUBIN URINE: NEGATIVE
Glucose, UA: NEGATIVE mg/dL
Ketones, ur: 15 mg/dL — AB
NITRITE: POSITIVE — AB
PROTEIN: NEGATIVE mg/dL
SPECIFIC GRAVITY, URINE: 1.025 (ref 1.005–1.030)
UROBILINOGEN UA: 1 mg/dL (ref 0.0–1.0)
pH: 6 (ref 5.0–8.0)

## 2014-10-03 LAB — COMPREHENSIVE METABOLIC PANEL
ALBUMIN: 3 g/dL — AB (ref 3.5–5.0)
ALK PHOS: 55 U/L (ref 38–126)
ALT: 11 U/L — ABNORMAL LOW (ref 14–54)
ANION GAP: 7 (ref 5–15)
AST: 17 U/L (ref 15–41)
BUN: 9 mg/dL (ref 6–20)
CALCIUM: 8.9 mg/dL (ref 8.9–10.3)
CHLORIDE: 104 mmol/L (ref 101–111)
CO2: 22 mmol/L (ref 22–32)
Creatinine, Ser: 0.7 mg/dL (ref 0.44–1.00)
GFR calc non Af Amer: >60 mL/min (ref >60)
GLUCOSE: 116 mg/dL — AB (ref 65–99)
POTASSIUM: 3.7 mmol/L (ref 3.5–5.1)
SODIUM: 133 mmol/L — AB (ref 135–145)
Total Bilirubin: 0.3 mg/dL (ref 0.3–1.2)
Total Protein: 6.7 g/dL (ref 6.5–8.1)

## 2014-10-03 LAB — CBC
HEMATOCRIT: 31.8 % — AB (ref 36.0–46.0)
HEMOGLOBIN: 10.4 g/dL — AB (ref 12.0–15.0)
MCH: 26.1 pg (ref 26.0–34.0)
MCHC: 32.7 g/dL (ref 30.0–36.0)
MCV: 79.7 fL (ref 78.0–100.0)
Platelets: 380 10*3/uL (ref 150–400)
RBC: 3.99 MIL/uL (ref 3.87–5.11)
RDW: 14.8 % (ref 11.5–15.5)
WBC: 11.1 10*3/uL — ABNORMAL HIGH (ref 4.0–10.5)

## 2014-10-03 LAB — PROTEIN / CREATININE RATIO, URINE
CREATININE, URINE: 264 mg/dL
Protein Creatinine Ratio: 0.07 mg/mg{Cre} (ref 0.00–0.15)
Total Protein, Urine: 19 mg/dL

## 2014-10-03 MED ORDER — NITROFURANTOIN MONOHYD MACRO 100 MG PO CAPS
100.0000 mg | ORAL_CAPSULE | Freq: Two times a day (BID) | ORAL | Status: DC
Start: 2014-10-03 — End: 2014-10-21

## 2014-10-03 NOTE — Discharge Instructions (Signed)
Hypertension Hypertension is another name for high blood pressure. High blood pressure forces your heart to work harder to pump blood. A blood pressure reading has two numbers, which includes a higher number over a lower number (example: 110/72). HOME CARE   Have your blood pressure rechecked by your doctor.  Only take medicine as told by your doctor. Follow the directions carefully. The medicine does not work as well if you skip doses. Skipping doses also puts you at risk for problems.  Do not smoke.  Monitor your blood pressure at home as told by your doctor. GET HELP IF:  You think you are having a reaction to the medicine you are taking.  You have repeat headaches or feel dizzy.  You have puffiness (swelling) in your ankles.  You have trouble with your vision. GET HELP RIGHT AWAY IF:   You get a very bad headache and are confused.  You feel weak, numb, or faint.  You get chest or belly (abdominal) pain.  You throw up (vomit).  You cannot breathe very well. MAKE SURE YOU:   Understand these instructions.  Will watch your condition.  Will get help right away if you are not doing well or get worse. Document Released: 06/09/2007 Document Revised: 12/26/2012 Document Reviewed: 10/13/2012 Exit[employer] Patient Information 2015 [employer]. This information is not intended to replace advice given to you by your health care provider. Make sure you discuss any questions you have with your health care provider.  

## 2014-10-03 NOTE — MAU Note (Signed)
Pt presents to MAU from physicians office for monitoring for increase in blood pressure. Denies any headache or blurred vision.

## 2014-10-03 NOTE — MAU Note (Signed)
Pt sent from office for elevated b/p. Chronic HTN. Denies HEadache at this time

## 2014-10-03 NOTE — MAU Provider Note (Signed)
History     CSN: [id number]  Arrival date and time: 10/03/14 1418   First Provider Initiated Contact with Patient 10/03/14 1529      Chief Complaint  Patient presents with  . Hypertension   HPI   [name] is a 44 y.o. female [id number] at 6w6dpresenting for a BP check and labs. She was seen in Dr. MMarcheta Grammesoffice yesterday and had elevated pressures. She was instructed to come to the hospital for labs and the patient was not sure when she was supposed to come.  Yesterday her BP was 135/100; only one BP was taken while she was in the office. She was sent home from the office with BP medication and told that if her labs came back normal that she could go ahead an start the BP medication.   + fetal movements Denies vaginal bleeding or leaking of fluid   OB History    Gravida Para Term Preterm AB TAB SAB Ectopic Multiple Living   _0 Past Medical History  Diagnosis Date  . Hyperthyroidism   . Hypertension     Past Surgical History  Procedure Laterality Date  . Abdominal surgery    . Cesarean section      Family History  Problem Relation Age of Onset  . Hypertension Mother   . Diabetes Mother   . Heart disease Mother   . Asthma Daughter     Social History  Substance Use Topics  . Smoking status: Never Smoker   . Smokeless tobacco: None  . Alcohol Use: No    Allergies: No Known Allergies  Prescriptions prior to admission  Medication Sig Dispense Refill Last Dose  . ampicillin (PRINCIPEN) 500 MG capsule Take 500 mg by mouth 4 (four) times daily. Started medication on 09-11-14   09/18/2014 at Unknown time  . methimazole (TAPAZOLE) 5 MG tablet Take 5 mg by mouth daily.   09/18/2014 at Unknown time  . Prenatal Vit-Fe Fumarate-FA (PRENATAL VITAMIN PO) Take 1 tablet by mouth daily.    09/18/2014 at Unknown time   Results for orders placed or performed during the hospital encounter of 10/03/14 (from the past 48 hour(s))  Comprehensive metabolic panel      Status: Abnormal   Collection Time: 10/03/14  3:24 PM  Result Value Ref Range   Sodium 133 (L) 135 - 145 mmol/L   Potassium 3.7 3.5 - 5.1 mmol/L   Chloride 104 101 - 111 mmol/L   CO2 22 22 - 32 mmol/L   Glucose, Bld 116 (H) 65 - 99 mg/dL   BUN 9 6 - 20 mg/dL   Creatinine, Ser 0.70 0.44 - 1.00 mg/dL   Calcium 8.9 8.9 - 10.3 mg/dL   Total Protein 6.7 6.5 - 8.1 g/dL   Albumin 3.0 (L) 3.5 - 5.0 g/dL   AST 17 15 - 41 U/L   ALT 11 (L) 14 - 54 U/L   Alkaline Phosphatase 55 38 - 126 U/L   Total Bilirubin 0.3 0.3 - 1.2 mg/dL   GFR calc non Af Amer >60 >60 mL/min   GFR calc Af Amer >60 >60 mL/min    Comment: (NOTE) The eGFR has been calculated using the CKD EPI equation. This calculation has not been validated in all clinical situations. eGFR's persistently <60 mL/min signify possible Chronic Kidney Disease.    Anion gap 7 5 - 15  CBC     Status: Abnormal  Collection Time: 10/03/14  3:24 PM  Result Value Ref Range   WBC 11.1 (H) 4.0 - 10.5 K/uL   RBC 3.99 3.87 - 5.11 MIL/uL   Hemoglobin 10.4 (L) 12.0 - 15.0 g/dL   HCT 31.8 (L) 36.0 - 46.0 %   MCV 79.7 78.0 - 100.0 fL   MCH 26.1 26.0 - 34.0 pg   MCHC 32.7 30.0 - 36.0 g/dL   RDW 14.8 11.5 - 15.5 %   Platelets 380 150 - 400 K/uL  Protein / creatinine ratio, urine     Status: None   Collection Time: 10/03/14  4:00 PM  Result Value Ref Range   Creatinine, Urine 264.00 mg/dL   Total Protein, Urine 19 mg/dL    Comment: NO NORMAL RANGE ESTABLISHED FOR THIS TEST   Protein Creatinine Ratio 0.07 0.00 - 0.15 mg/mg[Cre]  Urinalysis, Routine w reflex microscopic (not at [facility])     Status: Abnormal   Collection Time: 10/03/14  4:00 PM  Result Value Ref Range   Color, Urine YELLOW YELLOW   APPearance CLOUDY (A) CLEAR   Specific Gravity, Urine 1.025 1.005 - 1.030   pH 6.0 5.0 - 8.0   Glucose, UA NEGATIVE NEGATIVE mg/dL   Hgb urine dipstick TRACE (A) NEGATIVE   Bilirubin Urine NEGATIVE NEGATIVE   Ketones, ur 15 (A) NEGATIVE mg/dL    Protein, ur NEGATIVE NEGATIVE mg/dL   Urobilinogen, UA 1.0 0.0 - 1.0 mg/dL   Nitrite POSITIVE (A) NEGATIVE   Leukocytes, UA LARGE (A) NEGATIVE  Urine microscopic-add on     Status: Abnormal   Collection Time: 10/03/14  4:00 PM  Result Value Ref Range   Squamous Epithelial / LPF FEW (A) RARE   WBC, UA 3-6 <3 WBC/hpf   RBC / HPF 0-2 <3 RBC/hpf   Bacteria, UA MANY (A) RARE   Urine-Other MUCOUS PRESENT    Patient Vitals for the past 24 hrs:  BP Temp Pulse Resp Height Weight  10/03/14 1702 123/70 mmHg - 83 - - -  10/03/14 1647 126/73 mmHg - 90 - - -  10/03/14 1632 129/73 mmHg - 88 - - -  10/03/14 1617 134/72 mmHg - 92 - - -  10/03/14 1603 137/66 mmHg - 90 - - -  10/03/14 1547 128/73 mmHg - 91 - - -  10/03/14 1532 127/67 mmHg - 94 - - -  10/03/14 1517 133/80 mmHg - (!) 121 - - -  10/03/14 1513 135/71 mmHg - 95 - - -  10/03/14 1456 129/70 mmHg 98.7 F (37.1 C) 95 18 5' 4" (1.626 m) 88.996 kg (196 lb 3.2 oz)    Review of Systems  Constitutional: Negative for fever.  Eyes: Negative for blurred vision.  Cardiovascular: Negative for leg swelling.  Gastrointestinal: Negative for abdominal pain.  Genitourinary: Positive for dysuria, urgency and frequency.  Neurological: Negative for dizziness and headaches.   Physical Exam   Blood pressure 129/70, pulse 95, temperature 98.7 F (37.1 C), resp. rate 18, height 5' 4" (1.626 m), weight 88.996 kg (196 lb 3.2 oz), last menstrual period 02/14/2014.  Physical Exam  Constitutional: She is oriented to person, place, and time. She appears well-developed and well-nourished. No distress.  HENT:  Head: Normocephalic.  Eyes: Pupils are equal, round, and reactive to light.  Neck: Neck supple.  Cardiovascular: Normal rate.   Respiratory: Effort normal.  GI: Soft. There is no tenderness.  Musculoskeletal: Normal range of motion. She exhibits no edema or tenderness.  Negative clonus   Neurological:  She is alert and oriented to person, place, and  time. She has normal reflexes. She displays normal reflexes. She exhibits normal muscle tone.  Skin: Skin is warm. She is not diaphoretic.  Psychiatric: Her behavior is normal.   Fetal Tracing: Baseline: 135 bpm Variability: moderate  Accelerations: 15x15 Decelerations: None Toco: Quiet   MAU Course  Procedures None   MDM Discussed HPI>Labs> and all BP readings with Dr. Jodi Mourning. Dr. Jodi Mourning would like the patient to discuss starting the BP medication with Dr. Ruthann Cancer tomorrow. Patient is aware that she needs to call the office tomorrow.   Assessment and Plan   A:  1. Elevated BP   2. UTI (urinary tract infection) during pregnancy, third trimester      P:  Discharge home in stable condition.  Patient to call Dr. Marcheta Grammes office tomorrow to inquire about taking BP medication. Dr. Jodi Mourning did not authorize the patient to start the BP medication due to her normal BP readings in MAU. Patient instructed to call first thing tomorrow.  Return to MAU with any abdominal pain, HA, blurred vision: preeclampsia precautions.  Fetal kick counts RX: Macrobid  Urine culture pending.    Lezlie Lye, NP 10/03/2014 3:45 PM

## 2014-10-03 NOTE — MAU Note (Signed)
Assumed care of patient.

## 2014-10-04 ENCOUNTER — Encounter (HOSPITAL_COMMUNITY): Payer: Self-pay

## 2014-10-04 ENCOUNTER — Other Ambulatory Visit (HOSPITAL_COMMUNITY): Payer: Self-pay | Admitting: Obstetrics and Gynecology

## 2014-10-04 ENCOUNTER — Ambulatory Visit (HOSPITAL_COMMUNITY)
Admission: RE | Admit: 2014-10-04 | Discharge: 2014-10-04 | Disposition: A | Discharge status: Home / Self Care | Payer: BC Managed Care – PPO | Source: Ambulatory Visit | Attending: Obstetrics | Admitting: Obstetrics

## 2014-10-04 DIAGNOSIS — O09523 Supervision of elderly multigravida, third trimester: Secondary | ICD-10-CM

## 2014-10-04 DIAGNOSIS — O3421 Maternal care for scar from previous cesarean delivery: Secondary | ICD-10-CM | POA: Insufficient documentation

## 2014-10-04 DIAGNOSIS — O283 Abnormal ultrasonic finding on antenatal screening of mother: Secondary | ICD-10-CM | POA: Insufficient documentation

## 2014-10-04 DIAGNOSIS — Z3A32 32 weeks gestation of pregnancy: Secondary | ICD-10-CM

## 2014-10-04 DIAGNOSIS — O99283 Endocrine, nutritional and metabolic diseases complicating pregnancy, third trimester: Secondary | ICD-10-CM

## 2014-10-04 DIAGNOSIS — E0590 Thyrotoxicosis, unspecified without thyrotoxic crisis or storm: Secondary | ICD-10-CM

## 2014-10-04 DIAGNOSIS — Z3A31 31 weeks gestation of pregnancy: Secondary | ICD-10-CM | POA: Diagnosis not present

## 2014-10-04 DIAGNOSIS — O10913 Unspecified pre-existing hypertension complicating pregnancy, third trimester: Secondary | ICD-10-CM

## 2014-10-04 DIAGNOSIS — O34219 Maternal care for unspecified type scar from previous cesarean delivery: Secondary | ICD-10-CM

## 2014-10-05 LAB — URINE CULTURE
Culture: >=100000
SPECIAL REQUESTS: NORMAL

## 2014-10-11 ENCOUNTER — Ambulatory Visit (HOSPITAL_COMMUNITY)
Admission: RE | Admit: 2014-10-11 | Discharge: 2014-10-11 | Disposition: A | Discharge status: Home / Self Care | Payer: BC Managed Care – PPO | Source: Ambulatory Visit | Attending: Obstetrics and Gynecology | Admitting: Obstetrics and Gynecology

## 2014-10-11 ENCOUNTER — Ambulatory Visit (HOSPITAL_COMMUNITY): Payer: Self-pay

## 2014-10-11 ENCOUNTER — Other Ambulatory Visit (HOSPITAL_COMMUNITY): Payer: Self-pay | Admitting: Obstetrics and Gynecology

## 2014-10-11 ENCOUNTER — Encounter (HOSPITAL_COMMUNITY): Payer: Self-pay

## 2014-10-11 ENCOUNTER — Other Ambulatory Visit (HOSPITAL_COMMUNITY): Payer: Self-pay | Admitting: *Deleted

## 2014-10-11 DIAGNOSIS — O09523 Supervision of elderly multigravida, third trimester: Secondary | ICD-10-CM

## 2014-10-11 DIAGNOSIS — E0590 Thyrotoxicosis, unspecified without thyrotoxic crisis or storm: Secondary | ICD-10-CM

## 2014-10-11 DIAGNOSIS — O162 Unspecified maternal hypertension, second trimester: Secondary | ICD-10-CM | POA: Diagnosis not present

## 2014-10-11 DIAGNOSIS — Z3A33 33 weeks gestation of pregnancy: Secondary | ICD-10-CM | POA: Diagnosis not present

## 2014-10-11 DIAGNOSIS — O09529 Supervision of elderly multigravida, unspecified trimester: Secondary | ICD-10-CM

## 2014-10-11 DIAGNOSIS — O99283 Endocrine, nutritional and metabolic diseases complicating pregnancy, third trimester: Secondary | ICD-10-CM | POA: Diagnosis not present

## 2014-10-11 DIAGNOSIS — O34219 Maternal care for unspecified type scar from previous cesarean delivery: Secondary | ICD-10-CM | POA: Insufficient documentation

## 2014-10-11 DIAGNOSIS — Z98891 History of uterine scar from previous surgery: Secondary | ICD-10-CM

## 2014-10-18 ENCOUNTER — Other Ambulatory Visit (HOSPITAL_COMMUNITY): Payer: Self-pay | Admitting: Obstetrics and Gynecology

## 2014-10-18 ENCOUNTER — Encounter (HOSPITAL_COMMUNITY): Payer: Self-pay

## 2014-10-18 ENCOUNTER — Ambulatory Visit (HOSPITAL_COMMUNITY)
Admission: RE | Admit: 2014-10-18 | Discharge: 2014-10-18 | Disposition: A | Discharge status: Home / Self Care | Payer: BC Managed Care – PPO | Source: Ambulatory Visit | Attending: Obstetrics | Admitting: Obstetrics

## 2014-10-18 DIAGNOSIS — O34211 Maternal care for low transverse scar from previous cesarean delivery: Secondary | ICD-10-CM

## 2014-10-18 DIAGNOSIS — E0590 Thyrotoxicosis, unspecified without thyrotoxic crisis or storm: Secondary | ICD-10-CM | POA: Diagnosis not present

## 2014-10-18 DIAGNOSIS — O283 Abnormal ultrasonic finding on antenatal screening of mother: Secondary | ICD-10-CM | POA: Insufficient documentation

## 2014-10-18 DIAGNOSIS — O10019 Pre-existing essential hypertension complicating pregnancy, unspecified trimester: Secondary | ICD-10-CM

## 2014-10-18 DIAGNOSIS — Z3A33 33 weeks gestation of pregnancy: Secondary | ICD-10-CM | POA: Diagnosis not present

## 2014-10-18 DIAGNOSIS — O09523 Supervision of elderly multigravida, third trimester: Secondary | ICD-10-CM

## 2014-10-18 DIAGNOSIS — O99283 Endocrine, nutritional and metabolic diseases complicating pregnancy, third trimester: Secondary | ICD-10-CM

## 2014-10-18 DIAGNOSIS — O10013 Pre-existing essential hypertension complicating pregnancy, third trimester: Secondary | ICD-10-CM | POA: Insufficient documentation

## 2014-10-21 ENCOUNTER — Encounter (HOSPITAL_COMMUNITY): Payer: Self-pay | Admitting: *Deleted

## 2014-10-21 ENCOUNTER — Inpatient Hospital Stay (HOSPITAL_COMMUNITY)
Admission: AD | Admit: 2014-10-21 | Discharge: 2014-10-21 | Disposition: A | Discharge status: Home / Self Care | Payer: BC Managed Care – PPO | Source: Ambulatory Visit | Attending: Obstetrics | Admitting: Obstetrics

## 2014-10-21 DIAGNOSIS — N888 Other specified noninflammatory disorders of cervix uteri: Secondary | ICD-10-CM

## 2014-10-21 DIAGNOSIS — Z3A34 34 weeks gestation of pregnancy: Secondary | ICD-10-CM | POA: Insufficient documentation

## 2014-10-21 DIAGNOSIS — R109 Unspecified abdominal pain: Secondary | ICD-10-CM | POA: Diagnosis present

## 2014-10-21 DIAGNOSIS — O4703 False labor before 37 completed weeks of gestation, third trimester: Secondary | ICD-10-CM

## 2014-10-21 DIAGNOSIS — O09523 Supervision of elderly multigravida, third trimester: Secondary | ICD-10-CM | POA: Diagnosis not present

## 2014-10-21 DIAGNOSIS — O2343 Unspecified infection of urinary tract in pregnancy, third trimester: Secondary | ICD-10-CM | POA: Diagnosis not present

## 2014-10-21 DIAGNOSIS — O471 False labor at or after 37 completed weeks of gestation: Secondary | ICD-10-CM | POA: Insufficient documentation

## 2014-10-21 DIAGNOSIS — O479 False labor, unspecified: Secondary | ICD-10-CM

## 2014-10-21 LAB — URINE MICROSCOPIC-ADD ON

## 2014-10-21 LAB — URINALYSIS, ROUTINE W REFLEX MICROSCOPIC
Bilirubin Urine: NEGATIVE
GLUCOSE, UA: NEGATIVE mg/dL
KETONES UR: NEGATIVE mg/dL
Nitrite: NEGATIVE
PH: 6 (ref 5.0–8.0)
Protein, ur: NEGATIVE mg/dL
Urobilinogen, UA: 0.2 mg/dL (ref 0.0–1.0)

## 2014-10-21 MED ORDER — CEPHALEXIN 500 MG PO CAPS: 500.0000 mg | ORAL_CAPSULE | Freq: Four times a day (QID) | ORAL | Status: DC

## 2014-10-21 NOTE — MAU Provider Note (Signed)
History     CSN: [id number]  Arrival date and time: 10/21/14 1027   First Provider Initiated Contact with Patient 10/21/14 1159      Chief Complaint  Patient presents with  . Abdominal Pain  . Back Pain  . Headache   HPI   [name] is a 44 y.o. female G5P4004 at 3485w2d presenting with ? ROM and dark brown/pink discharge that started Friday/ Saturday. Yesterday she saturated 4 panty liners with this fluid/ discharge.   She currently rates her pain 7/10; the pain is located in her lower stomach and groin with upper back pain. The pain started yesterday evening. + dysuria.   + fetal movements/ however less than normal.   No history of preterm deliveries.   OB History    Gravida Para Term Preterm AB TAB SAB Ectopic Multiple Living   5 4 4       4       Past Medical History  Diagnosis Date  . Hyperthyroidism   . Hypertension     Past Surgical History  Procedure Laterality Date  . Abdominal surgery    . Cesarean section      Family History  Problem Relation Age of Onset  . Hypertension Mother   . Diabetes Mother   . Heart disease Mother   . Asthma Daughter     Social History  Substance Use Topics  . Smoking status: Never Smoker   . Smokeless tobacco: None  . Alcohol Use: No    Allergies: No Known Allergies  Prescriptions prior to admission  Medication Sig Dispense Refill Last Dose  . Ca Carbonate-Mag Hydroxide (ROLAIDS PO) Take 1 tablet by mouth daily as needed (heartburn).   Past Week at Unknown time  . Ferrous Fumarate (IRON) 18 MG TBCR Take 1 tablet by mouth daily.   10/21/2014 at Unknown time  . methimazole (TAPAZOLE) 5 MG tablet Take 5 mg by mouth daily.   10/21/2014 at Unknown time  . Prenatal Vit-Fe Fumarate-FA (PRENATAL VITAMIN PO) Take 2 tablets by mouth daily.    10/21/2014 at Unknown time  . nitrofurantoin, macrocrystal-monohydrate, (MACROBID) 100 MG capsule Take 1 capsule (100 mg total) by mouth 2 (two) times daily. (Patient not taking:  Reported on 10/21/2014) 10 capsule 0 Completed Course at Unknown time   Results for orders placed or performed during the hospital encounter of 10/21/14 (from the past 24 hour(s))  Urinalysis, Routine w reflex microscopic ([facility])     Status: Abnormal   Collection Time: 10/21/14 11:14 AM  Result Value Ref Range   Color, Urine YELLOW YELLOW   APPearance HAZY (A) CLEAR   Specific Gravity, Urine >1.030 (H) 1.005 - 1.030   pH 6.0 5.0 - 8.0   Glucose, UA NEGATIVE NEGATIVE mg/dL   Hgb urine dipstick SMALL (A) NEGATIVE   Bilirubin Urine NEGATIVE NEGATIVE   Ketones, ur NEGATIVE NEGATIVE mg/dL   Protein, ur NEGATIVE NEGATIVE mg/dL   Urobilinogen, UA 0.2 0.0 - 1.0 mg/dL   Nitrite NEGATIVE NEGATIVE   Leukocytes, UA SMALL (A) NEGATIVE  Urine microscopic-add on     Status: Abnormal   Collection Time: 10/21/14 11:14 AM  Result Value Ref Range   Squamous Epithelial / LPF FEW (A) RARE   WBC, UA 7-10 <3 WBC/hpf   RBC / HPF 0-2 <3 RBC/hpf   Bacteria, UA MANY (A) RARE    Review of Systems  Constitutional: Negative for fever and chills.  Gastrointestinal: Positive for nausea and abdominal pain. Negative for  vomiting.  Genitourinary: Positive for dysuria (occasional ) and frequency. Negative for hematuria and flank pain.   Physical Exam   Blood pressure 134/77, pulse 83, temperature 97.6 F (36.4 C), temperature source Oral, resp. rate 18, last menstrual period 02/14/2014.  Physical Exam  Constitutional: She is oriented to person, place, and time. She appears well-developed and well-nourished. No distress.  Respiratory: Effort normal.  GI: Soft. There is no tenderness.  Genitourinary:  Speculum exam: Vagina - Small amount of creamy white discharge, no odor Cervix - + contact bleeding with speculum and fox swab.  Scant amount, no pooling of fluid in the vagina  Bimanual exam: Cervix FT, soft, posterior  Uterus non tender, Gravid  Chaperone present for exam.  Musculoskeletal: Normal  range of motion.  Neurological: She is alert and oriented to person, place, and time.  Skin: Skin is warm. She is not diaphoretic.  Psychiatric: Her behavior is normal.   Fetal Tracing: Baseline: 140 bpm  Variability: Accelerations:  Decelerations: Toco:  MAU Course  Procedures  None  MDM  AB+ blood type  Urine culture pending  Patient has an appointment with Dr. Gaynell Face on Wednesday.   Assessment and Plan   A:  1. Friable cervix   2. Braxton Hicks contractions   3. UTI in pregnancy, antepartum, third trimester    P:  Discharge home in stable condition  Follow up with Dr. Gaynell Face as scheduled Preterm labor precautions Return to MAU if symptoms worsen Pelvic rest Bleeding precautions.  RX: Keflex Urine culture pending Kick counts.    Duane Lope, NP 10/21/2014 11:59 AM

## 2014-10-21 NOTE — Discharge Instructions (Signed)
Braxton Hicks Contractions °Contractions of the uterus can occur throughout pregnancy. Contractions are not always a sign that you are in labor.  °WHAT ARE BRAXTON HICKS CONTRACTIONS?  °Contractions that occur before labor are called Braxton Hicks contractions, or false labor. Toward the end of pregnancy (32-34 weeks), these contractions can develop more often and may become more forceful. This is not true labor because these contractions do not result in opening (dilatation) and thinning of the cervix. They are sometimes difficult to tell apart from true labor because these contractions can be forceful and people have different pain tolerances. You should not feel embarrassed if you go to the hospital with false labor. Sometimes, the only way to tell if you are in true labor is for your health care provider to look for changes in the cervix. °If there are no prenatal problems or other health problems associated with the pregnancy, it is completely safe to be sent home with false labor and await the onset of true labor. °HOW CAN YOU TELL THE DIFFERENCE BETWEEN TRUE AND FALSE LABOR? °False Labor °· The contractions of false labor are usually shorter and not as hard as those of true labor.   °· The contractions are usually irregular.   °· The contractions are often felt in the front of the lower abdomen and in the groin.   °· The contractions may go away when you walk around or change positions while lying down.   °· The contractions get weaker and are shorter lasting as time goes on.   °· The contractions do not usually become progressively stronger, regular, and closer together as with true labor.   °True Labor °· Contractions in true labor last 30-70 seconds, become very regular, usually become more intense, and increase in frequency.   °· The contractions do not go away with walking.   °· The discomfort is usually felt in the top of the uterus and spreads to the lower abdomen and low back.   °· True labor can be  determined by your health care provider with an exam. This will show that the cervix is dilating and getting thinner.   °WHAT TO REMEMBER °· Keep up with your usual exercises and follow other instructions given by your health care provider.   °· Take medicines as directed by your health care provider.   °· Keep your regular prenatal appointments.   °· Eat and drink lightly if you think you are going into labor.   °· If Braxton Hicks contractions are making you uncomfortable:   °¨ Change your position from lying down or resting to walking, or from walking to resting.   °¨ Sit and rest in a tub of warm water.   °¨ Drink 2-3 glasses of water. Dehydration may cause these contractions.   °¨ Do slow and deep breathing several times an hour.   °WHEN SHOULD I SEEK IMMEDIATE MEDICAL CARE? °Seek immediate medical care if: °· Your contractions become stronger, more regular, and closer together.   °· You have fluid leaking or gushing from your vagina.   °· You have a fever.   °· You pass blood-tinged mucus.   °· You have vaginal bleeding.   °· You have continuous abdominal pain.   °· You have low back pain that you never had before.   °· You feel your baby's head pushing down and causing pelvic pressure.   °· Your baby is not moving as much as it used to.   °  °This information is not intended to replace advice given to you by your health care provider. Make sure you discuss any questions you have with your health care   provider. °  °Document Released: 12/21/2004 Document Revised: 12/26/2012 Document Reviewed: 10/02/2012 °Elsevier Interactive Patient Education ©2016 [employer] ° °

## 2014-10-21 NOTE — MAU Note (Signed)
Has been weak & nauseated since Saturday, vomited once or twice, also HA & upper back pain.  Has been bleeding, yesterday filled panti liner every 1-2 hours.  Has changed one panti liner this a.m.  Denies LOF.

## 2014-10-23 LAB — OB RESULTS CONSOLE GC/CHLAMYDIA
Chlamydia: NEGATIVE
Gonorrhea: NEGATIVE

## 2014-10-23 LAB — OB RESULTS CONSOLE GBS: STREP GROUP B AG: NEGATIVE

## 2014-10-23 LAB — URINE CULTURE
Culture: >=100000
SPECIAL REQUESTS: NORMAL

## 2014-10-25 ENCOUNTER — Ambulatory Visit (HOSPITAL_COMMUNITY): Payer: Self-pay

## 2014-10-25 ENCOUNTER — Ambulatory Visit (HOSPITAL_COMMUNITY)
Admission: RE | Admit: 2014-10-25 | Discharge: 2014-10-25 | Disposition: A | Discharge status: Home / Self Care | Payer: BC Managed Care – PPO | Source: Ambulatory Visit | Attending: Obstetrics and Gynecology | Admitting: Obstetrics and Gynecology

## 2014-10-25 DIAGNOSIS — O09523 Supervision of elderly multigravida, third trimester: Secondary | ICD-10-CM | POA: Diagnosis present

## 2014-10-25 DIAGNOSIS — E0590 Thyrotoxicosis, unspecified without thyrotoxic crisis or storm: Secondary | ICD-10-CM

## 2014-10-25 DIAGNOSIS — O99283 Endocrine, nutritional and metabolic diseases complicating pregnancy, third trimester: Secondary | ICD-10-CM | POA: Insufficient documentation

## 2014-11-01 ENCOUNTER — Encounter (HOSPITAL_COMMUNITY): Payer: Self-pay

## 2014-11-01 ENCOUNTER — Ambulatory Visit (HOSPITAL_COMMUNITY)
Admission: RE | Admit: 2014-11-01 | Discharge: 2014-11-01 | Disposition: A | Discharge status: Home / Self Care | Payer: BC Managed Care – PPO | Source: Ambulatory Visit | Attending: Obstetrics | Admitting: Obstetrics

## 2014-11-01 DIAGNOSIS — O09529 Supervision of elderly multigravida, unspecified trimester: Secondary | ICD-10-CM | POA: Diagnosis not present

## 2014-11-08 ENCOUNTER — Other Ambulatory Visit (HOSPITAL_COMMUNITY): Payer: Self-pay | Admitting: Obstetrics and Gynecology

## 2014-11-08 ENCOUNTER — Ambulatory Visit (HOSPITAL_COMMUNITY)
Admission: RE | Admit: 2014-11-08 | Discharge: 2014-11-08 | Disposition: A | Discharge status: Home / Self Care | Payer: BC Managed Care – PPO | Source: Ambulatory Visit | Attending: Obstetrics | Admitting: Obstetrics

## 2014-11-08 DIAGNOSIS — E079 Disorder of thyroid, unspecified: Secondary | ICD-10-CM

## 2014-11-08 DIAGNOSIS — O09529 Supervision of elderly multigravida, unspecified trimester: Secondary | ICD-10-CM

## 2014-11-08 DIAGNOSIS — O10019 Pre-existing essential hypertension complicating pregnancy, unspecified trimester: Secondary | ICD-10-CM

## 2014-11-08 DIAGNOSIS — O283 Abnormal ultrasonic finding on antenatal screening of mother: Secondary | ICD-10-CM | POA: Insufficient documentation

## 2014-11-08 DIAGNOSIS — O34219 Maternal care for unspecified type scar from previous cesarean delivery: Secondary | ICD-10-CM | POA: Diagnosis not present

## 2014-11-08 DIAGNOSIS — O99280 Endocrine, nutritional and metabolic diseases complicating pregnancy, unspecified trimester: Secondary | ICD-10-CM | POA: Insufficient documentation

## 2014-11-08 DIAGNOSIS — O34211 Maternal care for low transverse scar from previous cesarean delivery: Secondary | ICD-10-CM

## 2014-11-08 DIAGNOSIS — O09523 Supervision of elderly multigravida, third trimester: Secondary | ICD-10-CM | POA: Insufficient documentation

## 2014-11-08 DIAGNOSIS — Z3A36 36 weeks gestation of pregnancy: Secondary | ICD-10-CM

## 2014-11-08 DIAGNOSIS — E0590 Thyrotoxicosis, unspecified without thyrotoxic crisis or storm: Secondary | ICD-10-CM | POA: Diagnosis not present

## 2014-11-08 DIAGNOSIS — O99283 Endocrine, nutritional and metabolic diseases complicating pregnancy, third trimester: Secondary | ICD-10-CM

## 2014-11-12 ENCOUNTER — Other Ambulatory Visit: Payer: Self-pay | Admitting: Obstetrics

## 2014-11-14 ENCOUNTER — Encounter (HOSPITAL_COMMUNITY): Payer: Self-pay | Admitting: *Deleted

## 2014-11-14 ENCOUNTER — Telehealth (HOSPITAL_COMMUNITY): Payer: Self-pay | Admitting: *Deleted

## 2014-11-14 NOTE — Telephone Encounter (Signed)
Preadmission screen  

## 2014-11-15 ENCOUNTER — Other Ambulatory Visit (HOSPITAL_COMMUNITY): Payer: Self-pay | Admitting: Obstetrics and Gynecology

## 2014-11-15 ENCOUNTER — Ambulatory Visit (HOSPITAL_COMMUNITY)
Admission: RE | Admit: 2014-11-15 | Discharge: 2014-11-15 | Disposition: A | Discharge status: Home / Self Care | Payer: BC Managed Care – PPO | Source: Ambulatory Visit | Attending: Obstetrics and Gynecology | Admitting: Obstetrics and Gynecology

## 2014-11-15 DIAGNOSIS — O10019 Pre-existing essential hypertension complicating pregnancy, unspecified trimester: Secondary | ICD-10-CM | POA: Diagnosis not present

## 2014-11-15 DIAGNOSIS — O09523 Supervision of elderly multigravida, third trimester: Secondary | ICD-10-CM | POA: Insufficient documentation

## 2014-11-15 DIAGNOSIS — Z3A27 27 weeks gestation of pregnancy: Secondary | ICD-10-CM | POA: Diagnosis not present

## 2014-11-15 DIAGNOSIS — O283 Abnormal ultrasonic finding on antenatal screening of mother: Secondary | ICD-10-CM | POA: Insufficient documentation

## 2014-11-15 DIAGNOSIS — Z3A37 37 weeks gestation of pregnancy: Secondary | ICD-10-CM

## 2014-11-15 DIAGNOSIS — O99280 Endocrine, nutritional and metabolic diseases complicating pregnancy, unspecified trimester: Secondary | ICD-10-CM

## 2014-11-15 DIAGNOSIS — O34219 Maternal care for unspecified type scar from previous cesarean delivery: Secondary | ICD-10-CM

## 2014-11-15 DIAGNOSIS — E0590 Thyrotoxicosis, unspecified without thyrotoxic crisis or storm: Secondary | ICD-10-CM

## 2014-11-15 DIAGNOSIS — O09529 Supervision of elderly multigravida, unspecified trimester: Secondary | ICD-10-CM

## 2014-11-15 DIAGNOSIS — O10913 Unspecified pre-existing hypertension complicating pregnancy, third trimester: Secondary | ICD-10-CM

## 2014-11-19 ENCOUNTER — Encounter (HOSPITAL_COMMUNITY): Payer: Self-pay | Admitting: *Deleted

## 2014-11-22 ENCOUNTER — Inpatient Hospital Stay (HOSPITAL_COMMUNITY): Payer: BC Managed Care – PPO | Admitting: Anesthesiology

## 2014-11-22 ENCOUNTER — Encounter (HOSPITAL_COMMUNITY): Payer: Self-pay

## 2014-11-22 ENCOUNTER — Inpatient Hospital Stay (HOSPITAL_COMMUNITY)
Admission: RE | Admit: 2014-11-22 | Discharge: 2014-11-25 | DRG: 767 | Disposition: A | Discharge status: Home / Self Care | Payer: BC Managed Care – PPO | Source: Ambulatory Visit | Attending: Obstetrics | Admitting: Obstetrics

## 2014-11-22 DIAGNOSIS — O99284 Endocrine, nutritional and metabolic diseases complicating childbirth: Secondary | ICD-10-CM | POA: Diagnosis present

## 2014-11-22 DIAGNOSIS — O09529 Supervision of elderly multigravida, unspecified trimester: Secondary | ICD-10-CM

## 2014-11-22 DIAGNOSIS — Z302 Encounter for sterilization: Secondary | ICD-10-CM

## 2014-11-22 DIAGNOSIS — O164 Unspecified maternal hypertension, complicating childbirth: Secondary | ICD-10-CM | POA: Diagnosis present

## 2014-11-22 DIAGNOSIS — Z3A39 39 weeks gestation of pregnancy: Secondary | ICD-10-CM | POA: Diagnosis not present

## 2014-11-22 DIAGNOSIS — Z9851 Tubal ligation status: Secondary | ICD-10-CM

## 2014-11-22 DIAGNOSIS — E0590 Thyrotoxicosis, unspecified without thyrotoxic crisis or storm: Secondary | ICD-10-CM | POA: Diagnosis present

## 2014-11-22 DIAGNOSIS — O99214 Obesity complicating childbirth: Secondary | ICD-10-CM | POA: Diagnosis present

## 2014-11-22 DIAGNOSIS — Z6834 Body mass index (BMI) 34.0-34.9, adult: Secondary | ICD-10-CM

## 2014-11-22 DIAGNOSIS — E669 Obesity, unspecified: Secondary | ICD-10-CM | POA: Diagnosis present

## 2014-11-22 HISTORY — DX: Papillomavirus as the cause of diseases classified elsewhere: B97.7

## 2014-11-22 LAB — CBC
HEMATOCRIT: 30.1 % — AB (ref 36.0–46.0)
HEMOGLOBIN: 9.7 g/dL — AB (ref 12.0–15.0)
MCH: 24.6 pg — AB (ref 26.0–34.0)
MCHC: 32.2 g/dL (ref 30.0–36.0)
MCV: 76.2 fL — AB (ref 78.0–100.0)
Platelets: 327 10*3/uL (ref 150–400)
RBC: 3.95 MIL/uL (ref 3.87–5.11)
RDW: 15.2 % (ref 11.5–15.5)
WBC: 7.1 10*3/uL (ref 4.0–10.5)

## 2014-11-22 LAB — ABO/RH: ABO/RH(D): AB POS

## 2014-11-22 LAB — TYPE AND SCREEN
ABO/RH(D): AB POS
Antibody Screen: NEGATIVE

## 2014-11-22 MED ORDER — NITROFURANTOIN MONOHYD MACRO 100 MG PO CAPS
100.0000 mg | ORAL_CAPSULE | Freq: Two times a day (BID) | ORAL | Status: DC
  Administered 2014-11-22: 100 mg via ORAL
  Filled 2014-11-22 (×2): qty 1

## 2014-11-22 MED ORDER — BUTORPHANOL TARTRATE 1 MG/ML IJ SOLN: 1.0000 mg | INTRAMUSCULAR | Status: DC | PRN

## 2014-11-22 MED ORDER — LACTATED RINGERS IV SOLN
INTRAVENOUS | Status: DC
  Administered 2014-11-22 (×2): via INTRAVENOUS

## 2014-11-22 MED ORDER — FENTANYL 2.5 MCG/ML BUPIVACAINE 1/10 % EPIDURAL INFUSION (WH - ANES): 14.0000 mL/h | INTRAMUSCULAR | Status: DC | PRN

## 2014-11-22 MED ORDER — CITRIC ACID-SODIUM CITRATE 334-500 MG/5ML PO SOLN: 30.0000 mL | ORAL | Status: DC | PRN

## 2014-11-22 MED ORDER — OXYTOCIN 40 UNITS IN LACTATED RINGERS INFUSION - SIMPLE MED
62.5000 mL/h | INTRAVENOUS | Status: DC
  Administered 2014-11-23: 62.5 mL/h via INTRAVENOUS

## 2014-11-22 MED ORDER — TERBUTALINE SULFATE 1 MG/ML IJ SOLN
0.2500 mg | Freq: Once | INTRAMUSCULAR | Status: DC | PRN
  Filled 2014-11-22: qty 1

## 2014-11-22 MED ORDER — FLEET ENEMA 7-19 GM/118ML RE ENEM: 1.0000 | ENEMA | RECTAL | Status: DC | PRN

## 2014-11-22 MED ORDER — ONDANSETRON HCL 4 MG/2ML IJ SOLN: 4.0000 mg | Freq: Four times a day (QID) | INTRAMUSCULAR | Status: DC | PRN

## 2014-11-22 MED ORDER — ACETAMINOPHEN 325 MG PO TABS: 650.0000 mg | ORAL_TABLET | ORAL | Status: DC | PRN

## 2014-11-22 MED ORDER — METHIMAZOLE 5 MG PO TABS
5.0000 mg | ORAL_TABLET | Freq: Every day | ORAL | Status: DC
  Administered 2014-11-23 – 2014-11-24 (×2): 5 mg via ORAL
  Filled 2014-11-22 (×5): qty 1

## 2014-11-22 MED ORDER — LABETALOL HCL 200 MG PO TABS
200.0000 mg | ORAL_TABLET | Freq: Two times a day (BID) | ORAL | Status: DC
  Filled 2014-11-22: qty 1

## 2014-11-22 MED ORDER — FENTANYL 2.5 MCG/ML BUPIVACAINE 1/10 % EPIDURAL INFUSION (WH - ANES)
INTRAMUSCULAR | Status: AC
  Administered 2014-11-22: 14 mL/h via EPIDURAL
  Filled 2014-11-22: qty 125

## 2014-11-22 MED ORDER — LIDOCAINE HCL (PF) 1 % IJ SOLN
30.0000 mL | INTRAMUSCULAR | Status: DC | PRN
  Filled 2014-11-22: qty 30

## 2014-11-22 MED ORDER — PHENYLEPHRINE 40 MCG/ML (10ML) SYRINGE FOR IV PUSH (FOR BLOOD PRESSURE SUPPORT)
80.0000 ug | PREFILLED_SYRINGE | INTRAVENOUS | Status: DC | PRN
  Filled 2014-11-22: qty 2

## 2014-11-22 MED ORDER — LIDOCAINE HCL (PF) 1 % IJ SOLN
INTRAMUSCULAR | Status: DC | PRN
  Administered 2014-11-22 (×2): 4 mL via EPIDURAL

## 2014-11-22 MED ORDER — PHENYLEPHRINE 40 MCG/ML (10ML) SYRINGE FOR IV PUSH (FOR BLOOD PRESSURE SUPPORT)
PREFILLED_SYRINGE | INTRAVENOUS | Status: AC
  Filled 2014-11-22: qty 20

## 2014-11-22 MED ORDER — EPHEDRINE 5 MG/ML INJ
10.0000 mg | INTRAVENOUS | Status: DC | PRN
  Filled 2014-11-22: qty 2

## 2014-11-22 MED ORDER — DIPHENHYDRAMINE HCL 50 MG/ML IJ SOLN: 12.5000 mg | INTRAMUSCULAR | Status: DC | PRN

## 2014-11-22 MED ORDER — OXYTOCIN 40 UNITS IN LACTATED RINGERS INFUSION - SIMPLE MED
1.0000 m[IU]/min | INTRAVENOUS | Status: DC
  Administered 2014-11-22: 18 m[IU]/min via INTRAVENOUS
  Administered 2014-11-22: 16 m[IU]/min via INTRAVENOUS
  Administered 2014-11-22: 2 m[IU]/min via INTRAVENOUS
  Filled 2014-11-22: qty 1000

## 2014-11-22 MED ORDER — LABETALOL HCL 200 MG PO TABS
200.0000 mg | ORAL_TABLET | Freq: Two times a day (BID) | ORAL | Status: DC
  Administered 2014-11-22: 200 mg via ORAL
  Filled 2014-11-22 (×3): qty 1

## 2014-11-22 MED ORDER — FENTANYL 2.5 MCG/ML BUPIVACAINE 1/10 % EPIDURAL INFUSION (WH - ANES)
14.0000 mL/h | INTRAMUSCULAR | Status: DC | PRN
  Administered 2014-11-22: 14 mL/h via EPIDURAL
  Filled 2014-11-22: qty 125

## 2014-11-22 MED ORDER — LACTATED RINGERS IV SOLN: 500.0000 mL | INTRAVENOUS | Status: DC | PRN

## 2014-11-22 MED ORDER — FENTANYL CITRATE (PF) 100 MCG/2ML IJ SOLN: 50.0000 ug | INTRAMUSCULAR | Status: DC | PRN

## 2014-11-22 MED ORDER — OXYTOCIN BOLUS FROM INFUSION: 500.0000 mL | INTRAVENOUS | Status: DC

## 2014-11-22 NOTE — Progress Notes (Signed)
Patient ID: Chelsea Colon, female   DOB: March 29, 1970, 44 y.o.   MRN: [id number] Cervix is 1 cm 70% vertex -3 she has her epidural contracting every 2 minutes and AROM performed and the fluid is clear the patient also had a C-section with her first baby because of breech presentation postop

## 2014-11-22 NOTE — Anesthesia Preprocedure Evaluation (Signed)
Anesthesia Evaluation  Patient identified by MRN, date of birth, ID band Patient awake    Reviewed: Allergy & Precautions, H&P , NPO status , Patient's Chart, lab work & pertinent test results  Airway Mallampati: II  TM Distance: >3 FB Neck ROM: full    Dental no notable dental hx.    Pulmonary neg pulmonary ROS,    Pulmonary exam normal breath sounds clear to auscultation       Cardiovascular hypertension, Normal cardiovascular exam Rhythm:regular Rate:Normal     Neuro/Psych negative neurological ROS  negative psych ROS   GI/Hepatic negative GI ROS, Neg liver ROS,   Endo/Other  Hyperthyroidism obesity  Renal/GU negative Renal ROS  negative genitourinary   Musculoskeletal   Abdominal   Peds  Hematology negative hematology ROS (+)   Anesthesia Other Findings Pregnancy - hypertension, has had 4 previous vaginal deliveries  Platelets and allergies reviewed Denies active cardiac or pulmonary symptoms, METS > 4  Denies blood thinning medications, bleeding disorders, asthma, supine hypotension syndrome, previous anesthesia difficulties    Reproductive/Obstetrics (+) Pregnancy                             Anesthesia Physical Anesthesia Plan  ASA: II  Anesthesia Plan: Epidural   Post-op Pain Management:    Induction:   Airway Management Planned:   Additional Equipment:   Intra-op Plan:   Post-operative Plan:   Informed Consent: I have reviewed the patients History and Physical, chart, labs and discussed the procedure including the risks, benefits and alternatives for the proposed anesthesia with the patient or authorized representative who has indicated his/her understanding and acceptance.     Plan Discussed with:   Anesthesia Plan Comments:         Anesthesia Quick Evaluation

## 2014-11-22 NOTE — Anesthesia Procedure Notes (Signed)
Epidural Patient location during procedure: OB  Staffing Anesthesiologist: Alamin Mccuiston Performed by: anesthesiologist   Preanesthetic Checklist Completed: patient identified, site marked, surgical consent, pre-op evaluation, timeout performed, IV checked, risks and benefits discussed and monitors and equipment checked  Epidural Patient position: sitting Prep: site prepped and draped and DuraPrep Patient monitoring: continuous pulse ox and blood pressure Approach: midline Location: L3-L4 Injection technique: LOR saline  Needle:  Needle type: Tuohy  Needle gauge: 17 G Needle length: 9 cm and 9 Needle insertion depth: 7 cm Catheter type: closed end flexible Catheter size: 19 Gauge Catheter at skin depth: 12 cm Test dose: negative  Assessment Events: blood not aspirated, injection not painful, no injection resistance, negative IV test and no paresthesia  Additional Notes Patient identified. Risks/Benefits/Options discussed with patient including but not limited to bleeding, infection, nerve damage, paralysis, failed block, incomplete pain control, headache, blood pressure changes, nausea, vomiting, reactions to medications, itching and postpartum back pain. Confirmed with bedside nurse the patient's most recent platelet count. Confirmed with patient that they are not currently taking any anticoagulation, have any bleeding history or any family history of bleeding disorders. Patient expressed understanding and wished to proceed. All questions were answered. Sterile technique was used throughout the entire procedure. Please see nursing notes for vital signs. Test dose was given through epidural catheter and negative prior to continuing to dose epidural or start infusion. Warning signs of high block given to the patient including shortness of breath, tingling/numbness in hands, complete motor block, or any concerning symptoms with instructions to call for help. Patient was given  instructions on fall risk and not to get out of bed. All questions and concerns addressed with instructions to call with any issues or inadequate analgesia.  Reason for block:procedure for pain   

## 2014-11-22 NOTE — H&P (Signed)
This is Dr. Francoise CeoBernard Marshall dictating the history and physical on  Chelsea Colon  she's a 44 year old [facility]ida 5 [facility] GBS followed by MFM because of   her age and because of chronic hypertension for which she takes labetalol 200 mg twice a day and hyperthyroidism for which she takes methimazole 5 mg by mouth daily she is in for induction at 39 weeks because of MFM cervix 2 cm vertex -2-3 station Past medical history advanced maternal age Chronic hypertension controlled on labetalol 200 mg by mouth twice a day Hyperthyroidism on methimazole 5 mg by mouth daily Social history negative Family history negative System review negative Physical exam well-developed female in no distress HEENT negative Lungs clear to P&A Heart regular rhythm no murmurs no gallops Abdomen is a transverse scar at the umbilicus from a umbilical hernia repair as a baby Pelvic as described above Extremities negative

## 2014-11-23 ENCOUNTER — Encounter (HOSPITAL_COMMUNITY): Payer: Self-pay

## 2014-11-23 ENCOUNTER — Encounter (HOSPITAL_COMMUNITY)
Admission: RE | Disposition: A | Discharge status: Home / Self Care | Payer: Self-pay | Source: Ambulatory Visit | Attending: Obstetrics

## 2014-11-23 ENCOUNTER — Inpatient Hospital Stay (HOSPITAL_COMMUNITY): Payer: BC Managed Care – PPO | Admitting: Anesthesiology

## 2014-11-23 DIAGNOSIS — Z9851 Tubal ligation status: Secondary | ICD-10-CM

## 2014-11-23 HISTORY — DX: Tubal ligation status: Z98.51

## 2014-11-23 HISTORY — PX: TUBAL LIGATION: SHX77

## 2014-11-23 LAB — CBC
HEMATOCRIT: 26.2 % — AB (ref 36.0–46.0)
Hemoglobin: 8.5 g/dL — ABNORMAL LOW (ref 12.0–15.0)
MCH: 24.8 pg — AB (ref 26.0–34.0)
MCHC: 32.4 g/dL (ref 30.0–36.0)
MCV: 76.4 fL — AB (ref 78.0–100.0)
PLATELETS: 266 10*3/uL (ref 150–400)
RBC: 3.43 MIL/uL — ABNORMAL LOW (ref 3.87–5.11)
RDW: 15.5 % (ref 11.5–15.5)
WBC: 11.8 10*3/uL — ABNORMAL HIGH (ref 4.0–10.5)

## 2014-11-23 LAB — RPR: RPR Ser Ql: NONREACTIVE

## 2014-11-23 SURGERY — LIGATION, FALLOPIAN TUBE, POSTPARTUM
Anesthesia: Epidural | Site: Abdomen | Laterality: Bilateral

## 2014-11-23 MED ORDER — METOCLOPRAMIDE HCL 10 MG PO TABS
10.0000 mg | ORAL_TABLET | Freq: Once | ORAL | Status: AC
  Administered 2014-11-23: 10 mg via ORAL
  Filled 2014-11-23: qty 1

## 2014-11-23 MED ORDER — TETANUS-DIPHTH-ACELL PERTUSSIS 5-2.5-18.5 LF-MCG/0.5 IM SUSP: 0.5000 mL | Freq: Once | INTRAMUSCULAR | Status: DC

## 2014-11-23 MED ORDER — ONDANSETRON HCL 4 MG/2ML IJ SOLN: 4.0000 mg | INTRAMUSCULAR | Status: DC | PRN

## 2014-11-23 MED ORDER — DIBUCAINE 1 % RE OINT: 1.0000 "application " | TOPICAL_OINTMENT | RECTAL | Status: DC | PRN

## 2014-11-23 MED ORDER — LABETALOL HCL 200 MG PO TABS
200.0000 mg | ORAL_TABLET | Freq: Two times a day (BID) | ORAL | Status: DC
  Administered 2014-11-23 – 2014-11-24 (×4): 200 mg via ORAL
  Filled 2014-11-23 (×3): qty 1

## 2014-11-23 MED ORDER — LACTATED RINGERS IV SOLN
INTRAVENOUS | Status: DC
  Administered 2014-11-23: 20 mL/h via INTRAVENOUS

## 2014-11-23 MED ORDER — SIMETHICONE 80 MG PO CHEW: 80.0000 mg | CHEWABLE_TABLET | ORAL | Status: DC | PRN

## 2014-11-23 MED ORDER — SODIUM BICARBONATE 8.4 % IV SOLN
INTRAVENOUS | Status: DC | PRN
  Administered 2014-11-23: 5 mL via EPIDURAL
  Administered 2014-11-23: 4 mL via EPIDURAL
  Administered 2014-11-23: 3 mL via EPIDURAL
  Administered 2014-11-23: 5 mL via EPIDURAL
  Administered 2014-11-23: 3 mL via EPIDURAL

## 2014-11-23 MED ORDER — ONDANSETRON HCL 4 MG/2ML IJ SOLN
4.0000 mg | Freq: Once | INTRAMUSCULAR | Status: DC | PRN
Start: 2014-11-23 — End: 2014-11-23

## 2014-11-23 MED ORDER — ZOLPIDEM TARTRATE 5 MG PO TABS: 5.0000 mg | ORAL_TABLET | Freq: Every evening | ORAL | Status: DC | PRN

## 2014-11-23 MED ORDER — ONDANSETRON HCL 4 MG/2ML IJ SOLN
INTRAMUSCULAR | Status: DC | PRN
  Administered 2014-11-23: 4 mg via INTRAVENOUS

## 2014-11-23 MED ORDER — NITROFURANTOIN MONOHYD MACRO 100 MG PO CAPS
100.0000 mg | ORAL_CAPSULE | Freq: Two times a day (BID) | ORAL | Status: DC
  Administered 2014-11-23 – 2014-11-25 (×5): 100 mg via ORAL
  Filled 2014-11-23 (×6): qty 1

## 2014-11-23 MED ORDER — FERROUS SULFATE 325 (65 FE) MG PO TABS
325.0000 mg | ORAL_TABLET | Freq: Two times a day (BID) | ORAL | Status: DC
  Administered 2014-11-23 – 2014-11-25 (×4): 325 mg via ORAL
  Filled 2014-11-23 (×4): qty 1

## 2014-11-23 MED ORDER — LANOLIN HYDROUS EX OINT: TOPICAL_OINTMENT | CUTANEOUS | Status: DC | PRN

## 2014-11-23 MED ORDER — MIDAZOLAM HCL 2 MG/2ML IJ SOLN
INTRAMUSCULAR | Status: AC
  Filled 2014-11-23: qty 2

## 2014-11-23 MED ORDER — SENNOSIDES-DOCUSATE SODIUM 8.6-50 MG PO TABS: 2.0000 | ORAL_TABLET | ORAL | Status: DC

## 2014-11-23 MED ORDER — SENNOSIDES-DOCUSATE SODIUM 8.6-50 MG PO TABS
2.0000 | ORAL_TABLET | ORAL | Status: DC
  Administered 2014-11-23 – 2014-11-25 (×2): 2 via ORAL
  Filled 2014-11-23 (×2): qty 2

## 2014-11-23 MED ORDER — DIPHENHYDRAMINE HCL 25 MG PO CAPS: 25.0000 mg | ORAL_CAPSULE | Freq: Four times a day (QID) | ORAL | Status: DC | PRN

## 2014-11-23 MED ORDER — PRENATAL MULTIVITAMIN CH: 1.0000 | ORAL_TABLET | Freq: Every day | ORAL | Status: DC

## 2014-11-23 MED ORDER — BENZOCAINE-MENTHOL 20-0.5 % EX AERO: 1.0000 "application " | INHALATION_SPRAY | CUTANEOUS | Status: DC | PRN

## 2014-11-23 MED ORDER — ONDANSETRON HCL 4 MG PO TABS: 4.0000 mg | ORAL_TABLET | ORAL | Status: DC | PRN

## 2014-11-23 MED ORDER — IBUPROFEN 600 MG PO TABS
600.0000 mg | ORAL_TABLET | Freq: Four times a day (QID) | ORAL | Status: DC
  Administered 2014-11-23 – 2014-11-25 (×8): 600 mg via ORAL
  Filled 2014-11-23 (×8): qty 1

## 2014-11-23 MED ORDER — MIDAZOLAM HCL 2 MG/2ML IJ SOLN
INTRAMUSCULAR | Status: DC | PRN
  Administered 2014-11-23 (×2): 1 mg via INTRAVENOUS

## 2014-11-23 MED ORDER — IBUPROFEN 600 MG PO TABS: 600.0000 mg | ORAL_TABLET | Freq: Four times a day (QID) | ORAL | Status: DC

## 2014-11-23 MED ORDER — FERROUS SULFATE 325 (65 FE) MG PO TABS: 325.0000 mg | ORAL_TABLET | Freq: Two times a day (BID) | ORAL | Status: DC

## 2014-11-23 MED ORDER — OXYCODONE-ACETAMINOPHEN 5-325 MG PO TABS: 1.0000 | ORAL_TABLET | ORAL | Status: DC | PRN

## 2014-11-23 MED ORDER — FAMOTIDINE 20 MG PO TABS
40.0000 mg | ORAL_TABLET | Freq: Once | ORAL | Status: AC
  Administered 2014-11-23: 40 mg via ORAL
  Filled 2014-11-23: qty 2

## 2014-11-23 MED ORDER — WITCH HAZEL-GLYCERIN EX PADS: 1.0000 "application " | MEDICATED_PAD | CUTANEOUS | Status: DC | PRN

## 2014-11-23 MED ORDER — ACETAMINOPHEN 325 MG PO TABS
650.0000 mg | ORAL_TABLET | ORAL | Status: DC | PRN
Start: 2014-11-23 — End: 2014-11-23

## 2014-11-23 MED ORDER — PRENATAL MULTIVITAMIN CH
1.0000 | ORAL_TABLET | Freq: Every day | ORAL | Status: DC
  Administered 2014-11-23 – 2014-11-24 (×2): 1 via ORAL
  Filled 2014-11-23 (×2): qty 1

## 2014-11-23 MED ORDER — ACETAMINOPHEN 325 MG PO TABS: 650.0000 mg | ORAL_TABLET | ORAL | Status: DC | PRN

## 2014-11-23 MED ORDER — OXYCODONE-ACETAMINOPHEN 5-325 MG PO TABS
2.0000 | ORAL_TABLET | ORAL | Status: DC | PRN
  Administered 2014-11-24 (×4): 2 via ORAL
  Filled 2014-11-23 (×4): qty 2

## 2014-11-23 MED ORDER — FENTANYL CITRATE (PF) 100 MCG/2ML IJ SOLN: 25.0000 ug | INTRAMUSCULAR | Status: DC | PRN

## 2014-11-23 MED ORDER — OXYCODONE-ACETAMINOPHEN 5-325 MG PO TABS
1.0000 | ORAL_TABLET | ORAL | Status: DC | PRN
  Administered 2014-11-23 – 2014-11-25 (×5): 1 via ORAL
  Filled 2014-11-23 (×5): qty 1

## 2014-11-23 MED ORDER — OXYCODONE-ACETAMINOPHEN 5-325 MG PO TABS: 2.0000 | ORAL_TABLET | ORAL | Status: DC | PRN

## 2014-11-23 SURGICAL SUPPLY — 26 items
CHLORAPREP W/TINT 26ML (MISCELLANEOUS) ×3 IMPLANT
CLOTH BEACON ORANGE TIMEOUT ST (SAFETY) ×3 IMPLANT
CONTAINER PREFILL 10% NBF 15ML (MISCELLANEOUS) ×4 IMPLANT
DRSG OPSITE POSTOP 3X4 (GAUZE/BANDAGES/DRESSINGS) ×3 IMPLANT
ELECT REM PT RETURN 9FT ADLT (ELECTROSURGICAL) ×3
ELECTRODE REM PT RTRN 9FT ADLT (ELECTROSURGICAL) ×1 IMPLANT
GLOVE BIO SURGEON STRL SZ8.5 (GLOVE) ×3 IMPLANT
GLOVE BIOGEL PI IND STRL 7.0 (GLOVE) ×1 IMPLANT
GLOVE BIOGEL PI INDICATOR 7.0 (GLOVE) ×2
GOWN STRL REUS W/TWL 2XL LVL3 (GOWN DISPOSABLE) ×3 IMPLANT
GOWN STRL REUS W/TWL LRG LVL3 (GOWN DISPOSABLE) ×3 IMPLANT
LIQUID BAND (GAUZE/BANDAGES/DRESSINGS) ×3 IMPLANT
NS IRRIG 1000ML POUR BTL (IV SOLUTION) ×3 IMPLANT
PACK ABDOMINAL MINOR (CUSTOM PROCEDURE TRAY) ×3 IMPLANT
PENCIL BUTTON HOLSTER BLD 10FT (ELECTRODE) ×3 IMPLANT
SPONGE LAP 4X18 X RAY DECT (DISPOSABLE) IMPLANT
SUT GUT PLAIN 0 CT-3 TAN 27 (SUTURE) ×3 IMPLANT
SUT MNCRL AB 4-0 PS2 18 (SUTURE) ×3 IMPLANT
SUT PLAIN 2 0 (SUTURE)
SUT PLAIN 3 0 CT 1 27 (SUTURE) IMPLANT
SUT PLAIN ABS 2-0 54XMFL TIE (SUTURE) IMPLANT
SUT VIC AB 0 CT1 27 (SUTURE) ×3
SUT VIC AB 0 CT1 27XBRD ANBCTR (SUTURE) ×1 IMPLANT
TOWEL OR 17X24 6PK STRL BLUE (TOWEL DISPOSABLE) ×6 IMPLANT
TRAY FOLEY CATH SILVER 14FR (SET/KITS/TRAYS/PACK) ×3 IMPLANT
WATER STERILE IRR 1000ML POUR (IV SOLUTION) ×1 IMPLANT

## 2014-11-23 NOTE — Lactation Note (Signed)
This note was copied from the chart of Chelsea Colon. Lactation Consultation Note  Patient Name: Chelsea Colon: 11/23/2014 Reason for consult: Follow-up assessment   With this mom and term baby,now 17 hours old. Mom was not able to get baby latched. When I walked in the room, the baby was dressed in undershirt, stretchie, hand , hand  mits and socks, and 2 blankets. He felt warm, and was very red/flushed. Mom agreed to allow me to undress him down to a diaper, place in in cross cradle skin to skin, and he latched eagerly with strong suckles and visible swalloows, good breast movement. Mom very pleased, and knows to call for further questions/concerns.    Maternal Data Formula Feeding for Exclusion: Yes Reason for exclusion: Mother's choice to formula and breast feed on admission Has patient been taught Hand Expression?: Yes Does the patient have breastfeeding experience prior to this delivery?: No  Feeding Feeding Type: Breast Fed  LATCH Score/Interventions Latch: Grasps breast easily, tongue down, lips flanged, rhythmical sucking.  Audible Swallowing: Spontaneous and intermittent Intervention(s): Hand expression  Type of Nipple: Everted at rest and after stimulation  Comfort (Breast/Nipple): Soft / non-tender     Hold (Positioning): Assistance needed to correctly position infant at breast and maintain latch. Intervention(s): Breastfeeding basics reviewed;Support Pillows;Position options;Skin to skin  LATCH Score: 9  Lactation Tools Discussed/Used     Consult Status Consult Status: Follow-up Colon: 11/24/14 Follow-up type: In-patient    Alfred LevinsLee, Azad Calame Anne 11/23/2014, 7:52 PM

## 2014-11-23 NOTE — Lactation Note (Signed)
This note was copied from the chart of Chelsea Colon. Lactation Consultation Note  Patient Name: Chelsea Colon ZOXWR'UToday's DatNedra Colon: 11/23/2014 Reason for consult: Initial assessment   With this mom and term baby, now 3714 hours old. Mom reports baby lataching well, with no discomfort to her. Mom has 4 other children, but this is her first time breast feeding. Mom knows to feed on cues, and shown how deep to latch baby. The baby had just been fed a bottle of formula, since mom was not in the room. Mom will call for me to observie a latch, when baby next shows cues. Mom requested a hand pump, which I placed in her room. i will review the use of this later, when she calls. Mom has private insurance, so i suggested she call for a DEP.    Maternal Data Formula Feeding for Exclusion: Yes Reason for exclusion: Mother's choice to formula and breast feed on admission Has patient been taught Hand Expression?: Yes Does the patient have breastfeeding experience prior to this delivery?: No  Feeding Feeding Type: Bottle Fed - Formula  LATCH Score/Interventions    Audible Swallowing:  (tiny drop form one nipple on left) Intervention(s): Hand expression  Type of Nipple: Everted at rest and after stimulation  Comfort (Breast/Nipple): Soft / non-tender           Lactation Tools Discussed/Used     Consult Status Consult Status: Follow-up Date: 11/24/14 Follow-up type: In-patient    Alfred LevinsLee, [name] 11/23/2014, 5:55 PM

## 2014-11-23 NOTE — Anesthesia Postprocedure Evaluation (Signed)
  Anesthesia Post-op Note  Patient: Chelsea Colon  Procedure(s) Performed: Procedure(s): POST PARTUM TUBAL LIGATION (Bilateral)  Patient Location: Mother/Baby  Anesthesia Type:Epidural  Level of Consciousness: awake  Airway and Oxygen Therapy: Patient Spontanous Breathing  Post-op Pain: mild  Post-op Assessment: Patient's Cardiovascular Status Stable and Respiratory Function Stable              Post-op Vital Signs: stable  Last Vitals:  Filed Vitals:   11/23/14 1412  BP: 147/86  Pulse: 77  Temp: 37.4 C  Resp: 18    Complications: No apparent anesthesia complications

## 2014-11-23 NOTE — Transfer of Care (Signed)
Immediate Anesthesia Transfer of Care Note  Patient: Chelsea Colon  Procedure(s) Performed: Procedure(s): POST PARTUM TUBAL LIGATION (Bilateral)  Patient Location: PACU  Anesthesia Type:Epidural  Level of Consciousness: awake and alert   Airway & Oxygen Therapy: Patient Spontanous Breathing  Post-op Assessment: Report given to RN and Post -op Vital signs reviewed and stable  Post vital signs: Reviewed and stable  Last Vitals:  Filed Vitals:   11/23/14 1137  BP: 132/75  Pulse: 66  Temp:   Resp:     Complications: No apparent anesthesia complications

## 2014-11-23 NOTE — Anesthesia Postprocedure Evaluation (Signed)
  Anesthesia Post-op Note  Patient: Chelsea Colon  Procedure(s) Performed: Procedure(s) (LRB): POST PARTUM TUBAL LIGATION (Bilateral)  Patient Location: PACU  Anesthesia Type: General  Level of Consciousness: awake and alert   Airway and Oxygen Therapy: Patient Spontanous Breathing  Post-op Pain: mild  Post-op Assessment: Post-op Vital signs reviewed, Patient's Cardiovascular Status Stable, Respiratory Function Stable, Patent Airway and No signs of Nausea or vomiting  Last Vitals:  Filed Vitals:   11/23/14 1412  BP: 147/86  Pulse: 77  Temp: 37.4 C  Resp: 18    Post-op Vital Signs: stable   Complications: No apparent anesthesia complications

## 2014-11-23 NOTE — Addendum Note (Signed)
Addendum  created 11/23/14 1521 by Renford DillsJanet L Jerrian Mells, CRNA   Modules edited: Notes Section   Notes Section:  File: [id number]

## 2014-11-23 NOTE — Anesthesia Preprocedure Evaluation (Signed)
Anesthesia Evaluation  Patient identified by MRN, date of birth, ID band Patient awake    Reviewed: Allergy & Precautions, H&P , NPO status , Patient's Chart, lab work & pertinent test results  Airway Mallampati: II  TM Distance: >3 FB Neck ROM: full    Dental no notable dental hx.    Pulmonary neg pulmonary ROS,    Pulmonary exam normal breath sounds clear to auscultation       Cardiovascular hypertension, Normal cardiovascular exam Rhythm:regular Rate:Normal     Neuro/Psych negative neurological ROS  negative psych ROS   GI/Hepatic negative GI ROS, Neg liver ROS,   Endo/Other  Hyperthyroidism obesity  Renal/GU negative Renal ROS  negative genitourinary   Musculoskeletal   Abdominal   Peds  Hematology negative hematology ROS (+)   Anesthesia Other Findings Pregnancy - hypertension, has had 4 previous vaginal deliveries  Platelets and allergies reviewed Denies active cardiac or pulmonary symptoms, METS > 4  Denies blood thinning medications, bleeding disorders, asthma, supine hypotension syndrome, previous anesthesia difficulties    Reproductive/Obstetrics (+) Pregnancy                             Anesthesia Physical  Anesthesia Plan  ASA: II  Anesthesia Plan: Epidural   Post-op Pain Management:    Induction:   Airway Management Planned:   Additional Equipment:   Intra-op Plan:   Post-operative Plan:   Informed Consent: I have reviewed the patients History and Physical, chart, labs and discussed the procedure including the risks, benefits and alternatives for the proposed anesthesia with the patient or authorized representative who has indicated his/her understanding and acceptance.     Plan Discussed with:   Anesthesia Plan Comments: (NPO appropriate, here for tubal sterilization)        Anesthesia Quick Evaluation

## 2014-11-23 NOTE — Op Note (Signed)
Preop diagnosis multiparity desires postpartum tubal ligation Postop diagnosis postpartum tubal ligation Anesthesia epidural Surgeon Dr. Francoise CeoBernard Lorene Klimas Procedure patient placed on the operating table in the supine position abdomen prepped and draped bladder emptied with a Foley catheter a midline subumbilical incision 1 inch long was made below her hernia repair scar carried  Down  to the fascia fascia cleaned grasped  With two cochas  and the fascia and the peritoneum opened with the Mayo scissors the left tube was identified and grasped in the midportion with a Babcock clamp 0 plain suture placed in the  Meso salpinx  portion of tube within the clamp and this was tied an approximately 1 inch of tube transected the procedure was done in a similar fashion on the other side hemostasis satisfactory abdomen closed in layers peritoneum and fascia continuous suture of 0 Dexon and the skin closed with subcuticular stitch of 4-0 Monocryl blood loss less than 5 cc patient tolerated the procedure well

## 2014-11-24 LAB — CBC
HEMATOCRIT: 24.9 % — AB (ref 36.0–46.0)
Hemoglobin: 8 g/dL — ABNORMAL LOW (ref 12.0–15.0)
MCH: 24.8 pg — ABNORMAL LOW (ref 26.0–34.0)
MCHC: 32.1 g/dL (ref 30.0–36.0)
MCV: 77.3 fL — AB (ref 78.0–100.0)
PLATELETS: 248 10*3/uL (ref 150–400)
RBC: 3.22 MIL/uL — AB (ref 3.87–5.11)
RDW: 15.6 % — AB (ref 11.5–15.5)
WBC: 9.8 10*3/uL (ref 4.0–10.5)

## 2014-11-24 MED ORDER — MEASLES, MUMPS & RUBELLA VAC ~~LOC~~ INJ
0.5000 mL | INJECTION | Freq: Once | SUBCUTANEOUS | Status: AC
  Administered 2014-11-25: 0.5 mL via SUBCUTANEOUS
  Filled 2014-11-24 (×2): qty 0.5

## 2014-11-24 NOTE — Progress Notes (Signed)
Patient ID: Chelsea Colon, female   DOB: [birth year], 44 y.o.   MRN: [id number] Postpartum day one Blood pressure 1 4786 respiration 20 pulse 80 Fundus firm Lochia moderate Legs negative doing well

## 2014-11-25 ENCOUNTER — Encounter (HOSPITAL_COMMUNITY): Payer: Self-pay

## 2014-11-25 NOTE — Progress Notes (Signed)
Pt discharged to home as ordered, all verbal and written d/c instructions given- pt verbalizes understanding. Pt d/c'd in stable condition and w/out incident, accompanied by spouse and infant.

## 2014-11-25 NOTE — Progress Notes (Signed)
Patient ID: Chelsea Colon, female   DOB: 30-Aug-1970, 44 y.o.   MRN: [id number] Postpartum day 2 Blood pressure 01/28/1960 respiration 20 pulse 69 afebrile Fundus firm Incision clean and dry Lochia moderate Legs negative doing well home today

## 2014-11-25 NOTE — Discharge Instructions (Signed)
Discharge instructions   You can wash your hair  Shower  Eat what you want  Drink what you want  See me in 6 weeks  Your ankles are going to swell more in the next 2 weeks than when pregnant  No sex for 6 weeks   [name] A, MD 11/25/2014

## 2014-11-25 NOTE — Discharge Summary (Signed)
Obstetric Discharge Summary Reason for Admission: induction of labor Prenatal Procedures: none Intrapartum Procedures: spontaneous vaginal delivery Postpartum Procedures: P.P. tubal ligation Complications-Operative and Postpartum: none HEMOGLOBIN  Date Value Ref Range Status  11/24/2014 8.0* 12.0 - 15.0 g/dL Final   HCT  Date Value Ref Range Status  11/24/2014 24.9* 36.0 - 46.0 % Final    Physical Exam:  General: alert Lochia: appropriate Uterine Fundus: firm Incision: healing well DVT Evaluation: No evidence of DVT seen on physical exam.  Discharge Diagnoses: Term Pregnancy-delivered  Discharge Information: Date: 11/25/2014 Activity: pelvic rest Diet: routine Medications: Percocet Condition: improved Instructions: refer to practice specific booklet Discharge to: home Follow-up Information    Follow up with Kathreen CosierMARSHALL,Waylyn Tenbrink A, MD.   Specialty:  Obstetrics and Gynecology   Contact information:   [address] [place] [address] [phone]       Newborn Data: Live born female  Birth Weight: 7 lb 2.8 oz (3255 g) APGAR: 8, 9  Home with mother.  Gilbert Narain A 11/25/2014, 6:22 AM

## 2014-11-26 ENCOUNTER — Encounter (HOSPITAL_COMMUNITY): Payer: Self-pay | Admitting: Obstetrics

## 2015-01-13 ENCOUNTER — Other Ambulatory Visit: Payer: Self-pay

## 2015-01-13 DIAGNOSIS — Z1231 Encounter for screening mammogram for malignant neoplasm of breast: Secondary | ICD-10-CM

## 2015-01-29 ENCOUNTER — Ambulatory Visit
Admission: RE | Admit: 2015-01-29 | Discharge: 2015-01-29 | Disposition: A | Discharge status: Home / Self Care | Payer: BC Managed Care – PPO | Source: Ambulatory Visit

## 2015-01-29 DIAGNOSIS — Z1231 Encounter for screening mammogram for malignant neoplasm of breast: Secondary | ICD-10-CM

## 2015-01-30 ENCOUNTER — Ambulatory Visit: Payer: Self-pay

## 2015-09-30 ENCOUNTER — Other Ambulatory Visit: Payer: BC Managed Care – PPO

## 2015-09-30 ENCOUNTER — Other Ambulatory Visit: Payer: Self-pay

## 2015-09-30 ENCOUNTER — Encounter: Payer: Self-pay | Admitting: *Deleted

## 2015-09-30 VITALS — BP 145/92 | HR 99 | Temp 99.1°F | Wt 178.8 lb

## 2015-09-30 DIAGNOSIS — B999 Unspecified infectious disease: Secondary | ICD-10-CM

## 2015-09-30 LAB — PROCEDURE REPORT - SCANNED: Pap: NEGATIVE

## 2015-09-30 NOTE — Progress Notes (Signed)
Patient is in the office requesting that her urine be tested for possible UTI. Patient states that she has a clear discharge, no smell or irritation, no pain but says that she previously had an infection that started in a similar way.

## 2015-10-02 LAB — URINE CULTURE: Organism ID, Bacteria: NO GROWTH

## 2015-12-22 ENCOUNTER — Other Ambulatory Visit: Payer: Self-pay | Admitting: Internal Medicine

## 2015-12-22 DIAGNOSIS — Z1231 Encounter for screening mammogram for malignant neoplasm of breast: Secondary | ICD-10-CM

## 2016-01-30 ENCOUNTER — Ambulatory Visit: Payer: Self-pay

## 2016-02-05 ENCOUNTER — Ambulatory Visit
Admission: RE | Admit: 2016-02-05 | Discharge: 2016-02-05 | Disposition: A | Discharge status: Home / Self Care | Payer: BC Managed Care – PPO | Source: Ambulatory Visit | Attending: Internal Medicine | Admitting: Internal Medicine

## 2016-02-05 DIAGNOSIS — Z1231 Encounter for screening mammogram for malignant neoplasm of breast: Secondary | ICD-10-CM

## 2016-06-01 ENCOUNTER — Ambulatory Visit: Payer: Self-pay | Admitting: Obstetrics

## 2016-06-08 ENCOUNTER — Ambulatory Visit: Payer: Self-pay | Admitting: Obstetrics

## 2016-06-28 ENCOUNTER — Encounter: Payer: Self-pay | Admitting: Obstetrics

## 2016-06-28 ENCOUNTER — Ambulatory Visit (INDEPENDENT_AMBULATORY_CARE_PROVIDER_SITE_OTHER): Payer: BC Managed Care – PPO | Admitting: Obstetrics

## 2016-06-28 VITALS — BP 132/86 | HR 90 | Ht 65.0 in | Wt 180.0 lb

## 2016-06-28 DIAGNOSIS — Z113 Encounter for screening for infections with a predominantly sexual mode of transmission: Secondary | ICD-10-CM

## 2016-06-28 DIAGNOSIS — Z01419 Encounter for gynecological examination (general) (routine) without abnormal findings: Secondary | ICD-10-CM

## 2016-06-28 NOTE — Progress Notes (Signed)
Subjective:        Chelsea Colon is a 46 y.o. female here for a routine exam.  Current complaints: None.    Personal health questionnaire:  Is patient [name], have a family history of breast and/or ovarian cancer: no Is there a family history of uterine cancer diagnosed at age < 4650, gastrointestinal cancer, urinary tract cancer, family member who is a Personnel officerLynch syndrome-associated carrier: no Is the patient overweight and hypertensive, family history of diabetes, personal history of gestational diabetes, preeclampsia or PCOS: no Is patient over 4555, have PCOS,  family history of premature CHD under age 46, diabetes, smoke, have hypertension or peripheral artery disease:  no At any time, has a partner hit, kicked or otherwise hurt or frightened you?: no Over the past 2 weeks, have you felt down, depressed or hopeless?: no Over the past 2 weeks, have you felt little interest or pleasure in doing things?:no   Gynecologic History Patient's last menstrual period was 06/07/2016. Contraception: tubal ligation Last Pap: 2017. Results were: normal Last mammogram: 2018. Results were: normal  Obstetric History OB History  Gravida Para Term Preterm AB Living  5 5 5     5   SAB TAB Ectopic Multiple Live Births        0 5    # Outcome Date GA Lbr Len/2nd Weight Sex Delivery Anes PTL Lv  5 Term 11/23/14 8056w1d 14:45 / 00:58 7 lb 2.8 oz (3.255 kg) M VBAC EPI  LIV  4 Term 2008 2453w0d  6 lb 8 oz (2.948 kg) F Vag-Spont   LIV  3 Term 2004   7 lb 1 oz (3.204 kg) F Vag-Spont   LIV  2 Term 1994 8753w0d  5 lb 14 oz (2.665 kg) F Vag-Spont   LIV  1 Term 1991   5 lb 4 oz (2.381 kg) F CS-LTranv   LIV      Past Medical History:  Diagnosis Date  . HPV (human papilloma virus) infection   . Hx of chlamydia infection 2004   Treated  . Hypertension    Chronic  . Hyperthyroidism   . Vaginal Pap smear, abnormal     Past Surgical History:  Procedure Laterality Date  . ABDOMINAL SURGERY    . CESAREAN  SECTION    . GYNECOLOGIC CRYOSURGERY    . HERNIA REPAIR    . TUBAL LIGATION Bilateral 11/23/2014   Procedure: POST PARTUM TUBAL LIGATION;  Surgeon: Kathreen CosierBernard A Marshall, MD;  Location: [facility];  Service: Gynecology;  Laterality: Bilateral;     Current Outpatient Prescriptions:  .  diclofenac (VOLTAREN) 75 MG EC tablet, Take 75 mg by mouth 2 (two) times daily., Disp: , Rfl:  .  hydrochlorothiazide (HYDRODIURIL) 25 MG tablet, Take 25 mg by mouth daily., Disp: , Rfl:  .  methimazole (TAPAZOLE) 10 MG tablet, Take 10 mg by mouth 3 (three) times daily., Disp: , Rfl:  Allergies  Allergen Reactions  . Lemon Extract [Flavoring Agent]     Lips swell    Social History  Substance Use Topics  . Smoking status: Never Smoker  . Smokeless tobacco: Never Used  . Alcohol use No    Family History  Problem Relation Age of Onset  . Hypertension Mother   . Diabetes Mother   . Heart disease Mother   . Asthma Daughter       Review of Systems  Constitutional: negative for fatigue and weight loss Respiratory: negative for cough and wheezing Cardiovascular: negative for  chest pain, fatigue and palpitations Gastrointestinal: negative for abdominal pain and change in bowel habits Musculoskeletal:negative for myalgias Neurological: negative for gait problems and tremors Behavioral/Psych: negative for abusive relationship, depression Endocrine: negative for temperature intolerance    Genitourinary:negative for abnormal menstrual periods, genital lesions, hot flashes, sexual problems and vaginal discharge Integument/breast: negative for breast lump, breast tenderness, nipple discharge and skin lesion(s)    Objective:       BP 132/86   Pulse 90   Ht 5\' 5"  (1.651 m)   Wt 180 lb (81.6 kg)   LMP 06/07/2016   BMI 29.95 kg/m  General:   alert  Skin:   no rash or abnormalities  Lungs:   clear to auscultation bilaterally  Heart:   regular rate and rhythm, S1, S2 normal, no murmur, click, rub or gallop   Breasts:   normal without suspicious masses, skin or nipple changes or axillary nodes  Abdomen:  normal findings: no organomegaly, soft, non-tender and no hernia  Pelvis:  External genitalia: normal general appearance Urinary system: urethral meatus normal and bladder without fullness, nontender Vaginal: normal without tenderness, induration or masses Cervix: normal appearance Adnexa: normal bimanual exam Uterus: anteverted and non-tender, normal size   Lab Review Urine pregnancy test Labs reviewed yes Radiologic studies reviewed yes  50% of 20 min visit spent on counseling and coordination of care.    Assessment:    Healthy female exam.    Plan:    Education reviewed: calcium supplements, depression evaluation, low fat, low cholesterol diet, safe sex/STD prevention, self breast exams and weight bearing exercise. Contraception: tubal ligation. Follow up in: 1 year.   Meds ordered this encounter  Medications  . methimazole (TAPAZOLE) 10 MG tablet    Sig: Take 10 mg by mouth 3 (three) times daily.  . diclofenac (VOLTAREN) 75 MG EC tablet    Sig: Take 75 mg by mouth 2 (two) times daily.   No orders of the defined types were placed in this encounter.

## 2016-06-29 LAB — CERVICOVAGINAL ANCILLARY ONLY
BACTERIAL VAGINITIS: POSITIVE — AB
Candida vaginitis: NEGATIVE
TRICH (WINDOWPATH): NEGATIVE

## 2016-06-30 ENCOUNTER — Other Ambulatory Visit: Payer: Self-pay | Admitting: Obstetrics

## 2016-06-30 DIAGNOSIS — N760 Acute vaginitis: Principal | ICD-10-CM

## 2016-06-30 DIAGNOSIS — B9689 Other specified bacterial agents as the cause of diseases classified elsewhere: Secondary | ICD-10-CM

## 2016-06-30 LAB — CYTOLOGY - PAP
Diagnosis: NEGATIVE
HPV (WINDOPATH): NOT DETECTED

## 2016-06-30 MED ORDER — METRONIDAZOLE 500 MG PO TABS: 500.0000 mg | ORAL_TABLET | Freq: Two times a day (BID) | ORAL | 2 refills | Status: DC

## 2017-01-21 ENCOUNTER — Other Ambulatory Visit: Payer: Self-pay | Admitting: Internal Medicine

## 2017-01-21 DIAGNOSIS — Z1231 Encounter for screening mammogram for malignant neoplasm of breast: Secondary | ICD-10-CM

## 2017-02-10 ENCOUNTER — Ambulatory Visit
Admission: RE | Admit: 2017-02-10 | Discharge: 2017-02-10 | Disposition: A | Discharge status: Home / Self Care | Payer: BC Managed Care – PPO | Source: Ambulatory Visit | Attending: Internal Medicine | Admitting: Internal Medicine

## 2017-02-10 DIAGNOSIS — Z1231 Encounter for screening mammogram for malignant neoplasm of breast: Secondary | ICD-10-CM
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
## 2017-06-10 IMAGING — US US MFM FETAL BPP W/O NON-STRESS
1 series · 14 of 25 positions shown · non-contrast
Comparison: none

[Series 1: us mfm fetal bpp w/o non-stress · 25 acquisitions, 14 frames shown]
[im 1/25]
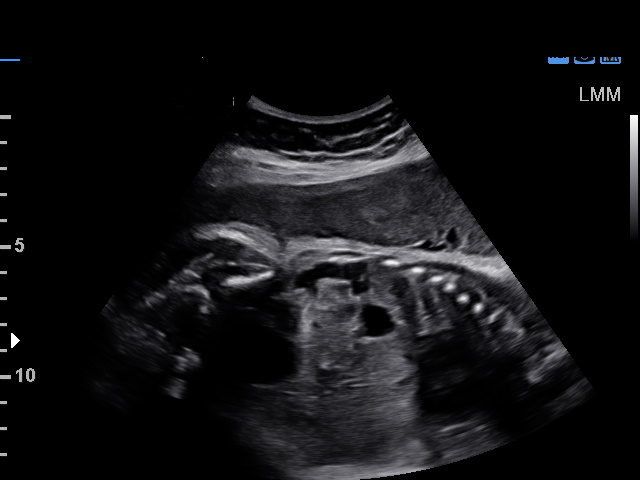
[im 3/25]
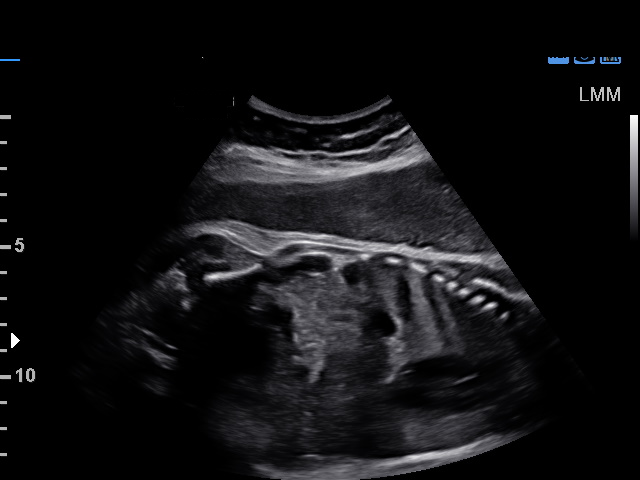
[im 5/25]
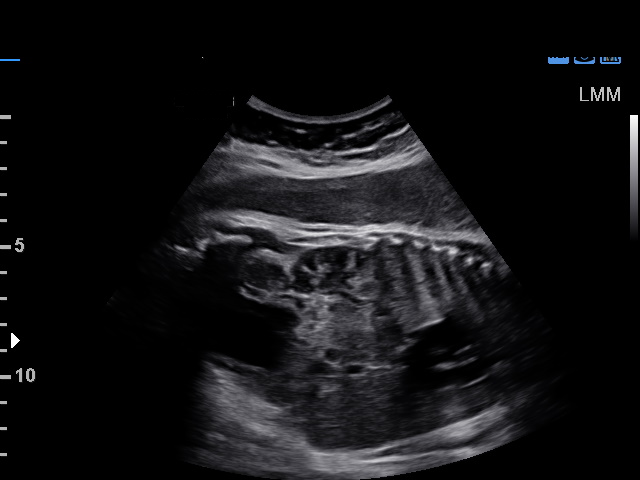
[im 7/25]
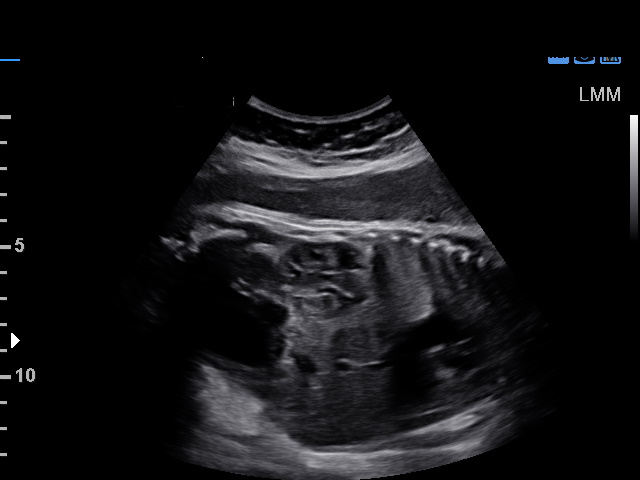
[im 9/25]
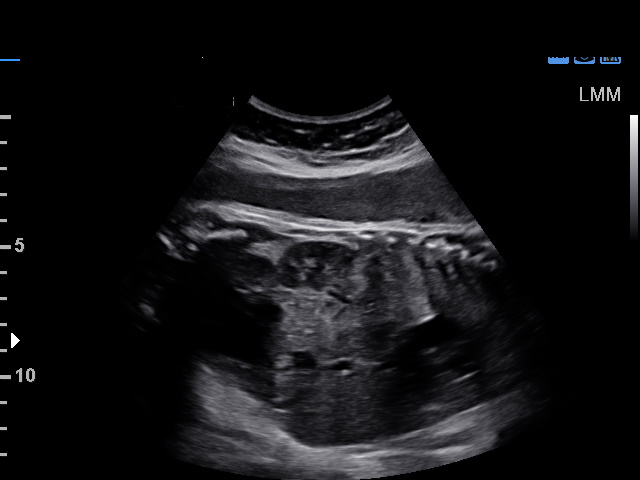
[im 10/25]
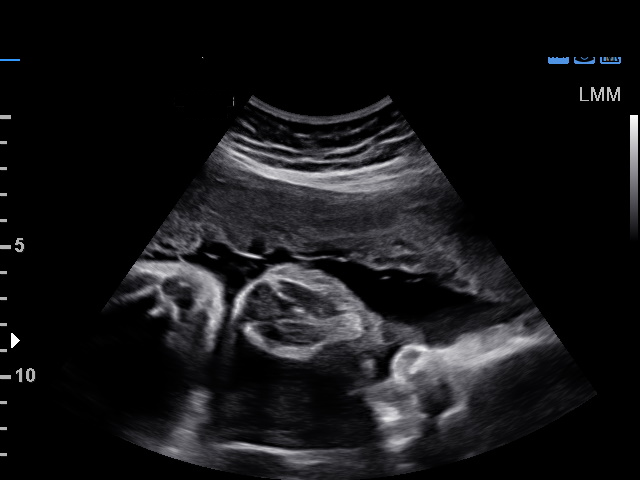
[im 12/25]
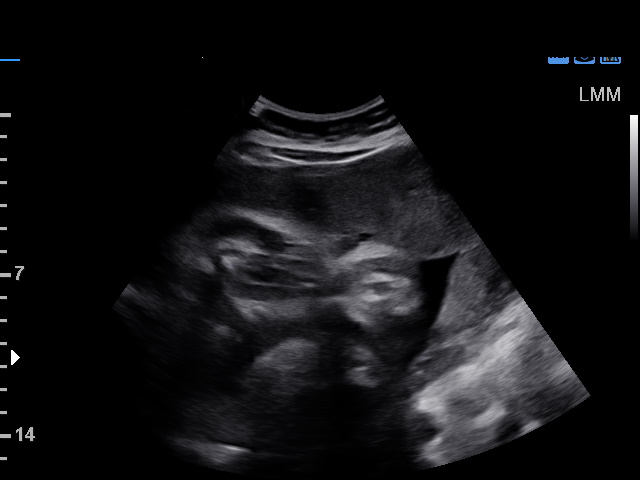
[im 14/25]
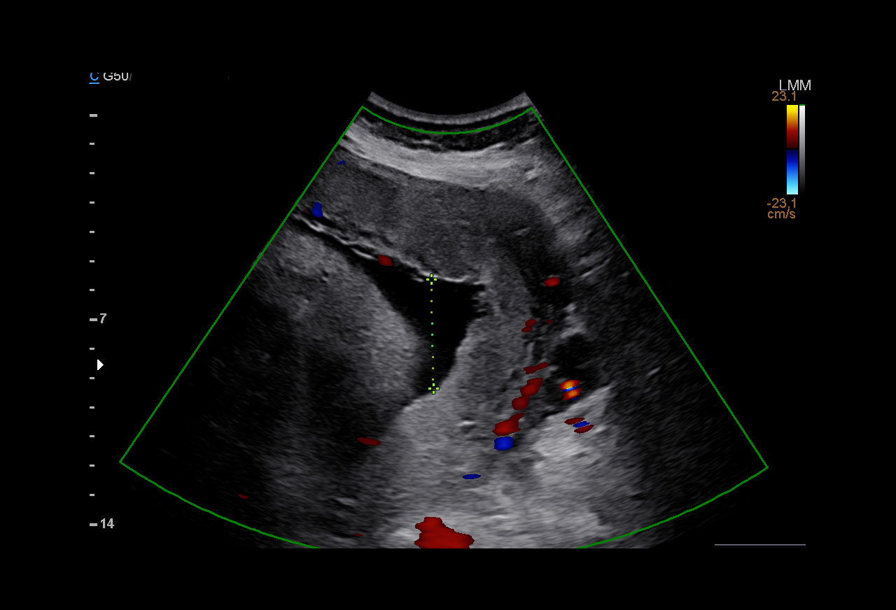
[im 16/25]
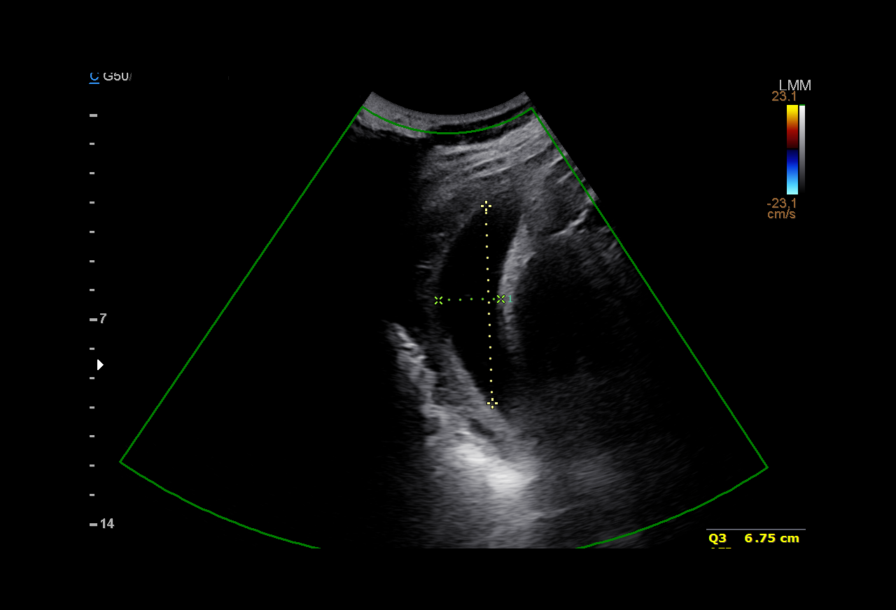
[im 17/25]
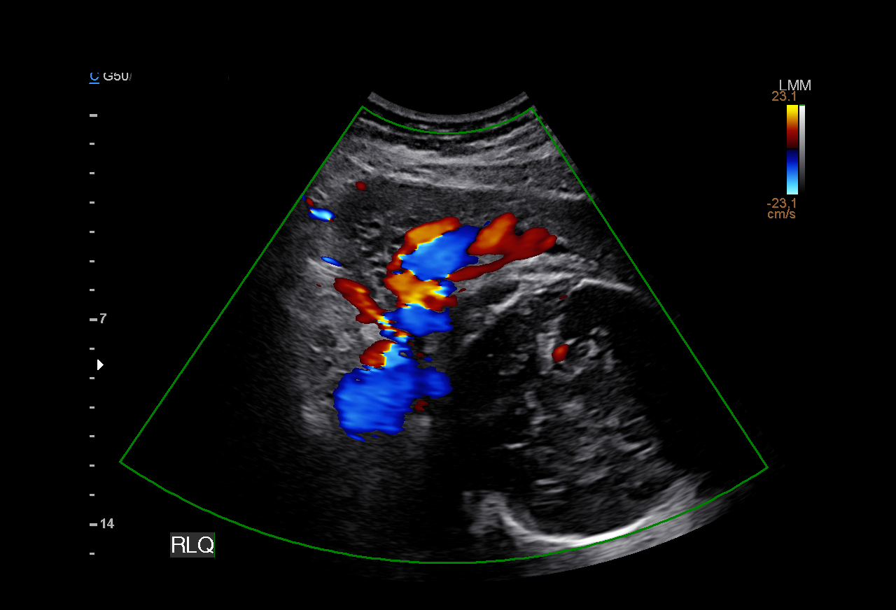
[im 19/25]
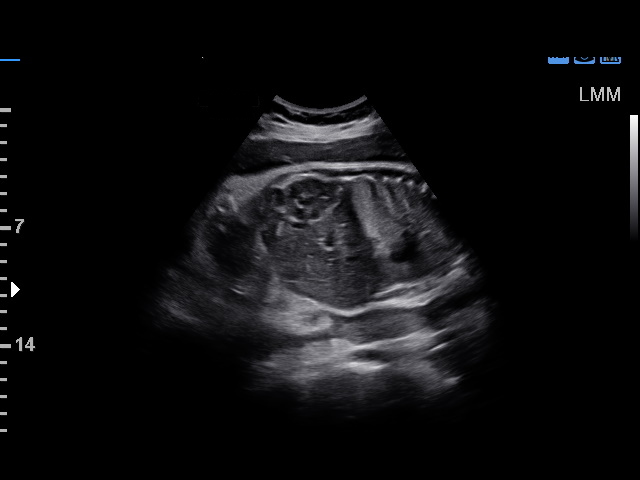
[im 21/25]
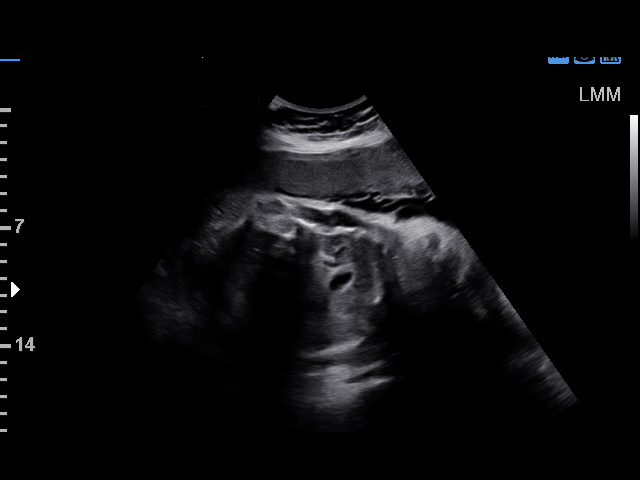
[im 23/25]
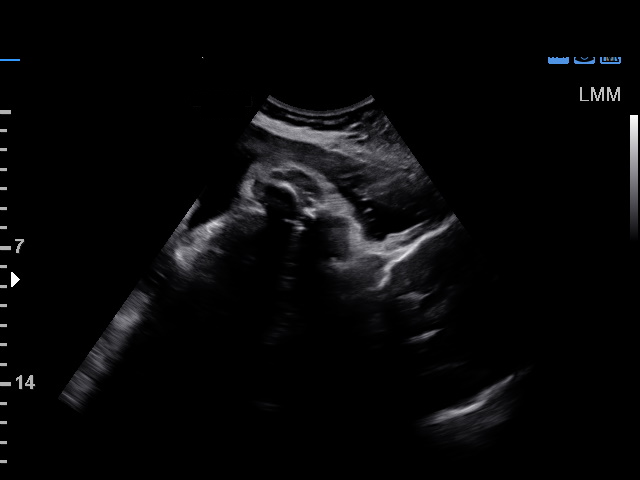
[im 25/25]
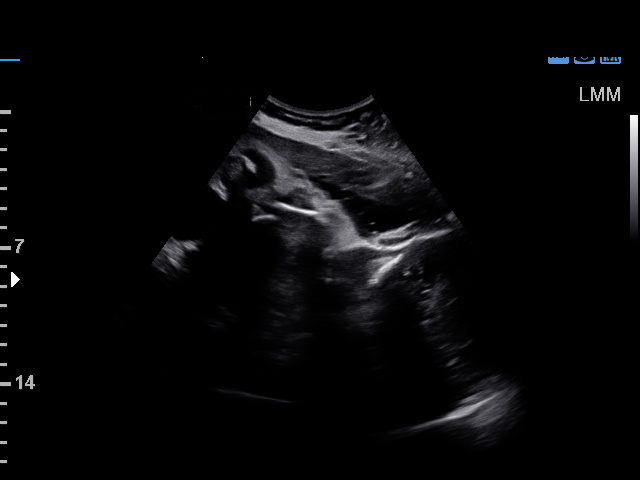

[14 of 25 positions shown; findings below may reference images not displayed]

OBSTETRICS REPORT
(Signed Final 10/25/2014 [DATE])

Service(s) Provided

Indications

Hyperthyroid (methimazole)
Hypertension - Chronic/Pre-existing
Previous cesarean section
Advanced maternal age multigravida 44, third
trimester (low risk NIPS)
Abnormal ultrasounic finding on antenatal
screening of mother (absent nasal bone)
34 weeks gestation of pregnancy
Fetal Evaluation

Num Of Fetuses:    1
Fetal Heart Rate:  151                          bpm
Cardiac Activity:  Observed
Presentation:      Cephalic

Amniotic Fluid
AFI FV:      Subjectively within normal limits
AFI Sum:     13.52   cm       46  %Tile     Larg Pckt:    6.75  cm
RUQ:   3.73    cm   LUQ:    3.04   cm    LLQ:   6.75    cm
Biophysical Evaluation

Amniotic F.V:   Pocket => 2 cm two         F. Tone:        Observed
planes
F. Movement:    Observed                   Score:          [DATE]
F. Breathing:   Observed
Gestational Age

LMP:           36w 1d        Date:  02/14/14                 EDD:   11/21/14
Best:          34w 6d     Det. By:  Early Ultrasound         EDD:   11/30/14
(04/24/14)
Impression

Single living intrauterine pregnancy at 34 weeks 6 days.
Normal amniotic fluid volume.
BPP [DATE].
Recommendations

Continue weekly BPPs.

questions or concerns.

## 2017-06-29 ENCOUNTER — Ambulatory Visit: Payer: Self-pay | Admitting: Obstetrics

## 2017-07-12 ENCOUNTER — Encounter: Payer: Self-pay | Admitting: Obstetrics

## 2017-07-12 ENCOUNTER — Other Ambulatory Visit (HOSPITAL_COMMUNITY)
Admission: RE | Admit: 2017-07-12 | Discharge: 2017-07-12 | Disposition: A | Discharge status: Home / Self Care | Payer: BC Managed Care – PPO | Source: Ambulatory Visit | Attending: Obstetrics | Admitting: Obstetrics

## 2017-07-12 ENCOUNTER — Ambulatory Visit (INDEPENDENT_AMBULATORY_CARE_PROVIDER_SITE_OTHER): Payer: BC Managed Care – PPO | Admitting: Obstetrics

## 2017-07-12 VITALS — BP 134/86 | HR 80 | Ht 65.0 in | Wt 181.8 lb

## 2017-07-12 DIAGNOSIS — N898 Other specified noninflammatory disorders of vagina: Secondary | ICD-10-CM | POA: Diagnosis not present

## 2017-07-12 DIAGNOSIS — Z01411 Encounter for gynecological examination (general) (routine) with abnormal findings: Secondary | ICD-10-CM | POA: Diagnosis not present

## 2017-07-12 DIAGNOSIS — Z1151 Encounter for screening for human papillomavirus (HPV): Secondary | ICD-10-CM | POA: Diagnosis not present

## 2017-07-12 DIAGNOSIS — K5901 Slow transit constipation: Secondary | ICD-10-CM

## 2017-07-12 DIAGNOSIS — R87612 Low grade squamous intraepithelial lesion on cytologic smear of cervix (LGSIL): Secondary | ICD-10-CM | POA: Insufficient documentation

## 2017-07-12 DIAGNOSIS — Z01419 Encounter for gynecological examination (general) (routine) without abnormal findings: Secondary | ICD-10-CM

## 2017-07-12 DIAGNOSIS — Z0000 Encounter for general adult medical examination without abnormal findings: Secondary | ICD-10-CM

## 2017-07-12 DIAGNOSIS — N946 Dysmenorrhea, unspecified: Secondary | ICD-10-CM

## 2017-07-12 MED ORDER — PNV-SELECT 27-0.6-0.4 MG PO TABS: 1.0000 | ORAL_TABLET | Freq: Every day | ORAL | 11 refills | Status: DC

## 2017-07-12 MED ORDER — DOCUSATE SODIUM 100 MG PO CAPS: 100.0000 mg | ORAL_CAPSULE | Freq: Two times a day (BID) | ORAL | 11 refills | Status: DC

## 2017-07-12 MED ORDER — POLYETHYLENE GLYCOL 3350 17 G PO PACK: 17.0000 g | PACK | Freq: Every evening | ORAL | 11 refills | Status: DC | PRN

## 2017-07-12 MED ORDER — IBUPROFEN 800 MG PO TABS: 800.0000 mg | ORAL_TABLET | Freq: Three times a day (TID) | ORAL | 5 refills | Status: DC | PRN

## 2017-07-12 NOTE — Addendum Note (Signed)
Addended by: Natale MilchSTALLING,  D on: 07/12/2017 02:32 PM   Modules accepted: Orders

## 2017-07-12 NOTE — Progress Notes (Signed)
Patient is in the office for annual, last pap 06-28-16. Patient has hx of BTL. Last mammogram 02-10-17

## 2017-07-12 NOTE — Progress Notes (Signed)
Subjective:        Chelsea Colon is a 47 y.o. female here for a routine exam.  Current complaints: None.    Personal health questionnaire:  Is patient [name], have a family history of breast and/or ovarian cancer: no Is there a family history of uterine cancer diagnosed at age < 78, gastrointestinal cancer, urinary tract cancer, family member who is a Personnel officer syndrome-associated carrier: no Is the patient overweight and hypertensive, family history of diabetes, personal history of gestational diabetes, preeclampsia or PCOS: no Is patient over 57, have PCOS,  family history of premature CHD under age 27, diabetes, smoke, have hypertension or peripheral artery disease:  no At any time, has a partner hit, kicked or otherwise hurt or frightened you?: no Over the past 2 weeks, have you felt down, depressed or hopeless?: no Over the past 2 weeks, have you felt little interest or pleasure in doing things?:no   Gynecologic History Patient's last menstrual period was 06/30/2017. Contraception: tubal ligation Last Pap: 2018. Results were: normal Last mammogram: 2019. Results were: normal  Obstetric History OB History  Gravida Para Term Preterm AB Living  5 5 5     5   SAB TAB Ectopic Multiple Live Births        0 5    # Outcome Date GA Lbr Len/2nd Weight Sex Delivery Anes PTL Lv  5 Term 11/23/14 [redacted]w[redacted]d 14:45 / 00:58 7 lb 2.8 oz (3.255 kg) M VBAC EPI  LIV  4 Term 2008 [redacted]w[redacted]d  6 lb 8 oz (2.948 kg) F Vag-Spont   LIV  3 Term 2004   7 lb 1 oz (3.204 kg) F Vag-Spont   LIV  2 Term 1994 [redacted]w[redacted]d  5 lb 14 oz (2.665 kg) F Vag-Spont   LIV  1 Term 1991   5 lb 4 oz (2.381 kg) F CS-LTranv   LIV    Past Medical History:  Diagnosis Date  . HPV (human papilloma virus) infection   . Hx of chlamydia infection 2004   Treated  . Hypertension    Chronic  . Hyperthyroidism   . Vaginal Pap smear, abnormal     Past Surgical History:  Procedure Laterality Date  . ABDOMINAL SURGERY    . CESAREAN  SECTION    . GYNECOLOGIC CRYOSURGERY    . HERNIA REPAIR    . TUBAL LIGATION Bilateral 11/23/2014   Procedure: POST PARTUM TUBAL LIGATION;  Surgeon: Kathreen Cosier, MD;  Location: [facility];  Service: Gynecology;  Laterality: Bilateral;     Current Outpatient Medications:  .  cetirizine (ZYRTEC) 5 MG tablet, Take 5 mg by mouth daily., Disp: , Rfl:  .  hydrochlorothiazide (HYDRODIURIL) 25 MG tablet, Take 25 mg by mouth daily., Disp: , Rfl:  .  methimazole (TAPAZOLE) 10 MG tablet, Take 10 mg by mouth 3 (three) times daily., Disp: , Rfl:  .  diclofenac (VOLTAREN) 75 MG EC tablet, Take 75 mg by mouth 2 (two) times daily., Disp: , Rfl:  .  docusate sodium (COLACE) 100 MG capsule, Take 1 capsule (100 mg total) by mouth 2 (two) times daily., Disp: 60 capsule, Rfl: 11 .  ibuprofen (ADVIL,MOTRIN) 800 MG tablet, Take 1 tablet (800 mg total) by mouth every 8 (eight) hours as needed., Disp: 30 tablet, Rfl: 5 .  metroNIDAZOLE (FLAGYL) 500 MG tablet, Take 1 tablet (500 mg total) by mouth 2 (two) times daily. (Patient not taking: Reported on 07/12/2017), Disp: 14 tablet, Rfl: 2 .  polyethylene  glycol (MIRALAX / GLYCOLAX) packet, Take 17 g by mouth at bedtime as needed., Disp: 30 each, Rfl: 11 .  Prenatal Vit w/Fe-Methylfol-FA (PNV-SELECT) 27-0.6-0.4 MG TABS, Take 1 tablet by mouth daily before breakfast., Disp: 30 tablet, Rfl: 11 Allergies  Allergen Reactions  . Lemon Extract [Flavoring Agent]     Lips swell    Social History   Tobacco Use  . Smoking status: Never Smoker  . Smokeless tobacco: Never Used  Substance Use Topics  . Alcohol use: No    Family History  Problem Relation Age of Onset  . Hypertension Mother   . Diabetes Mother   . Heart disease Mother   . Asthma Daughter   . Breast cancer Paternal Aunt       Review of Systems  Constitutional: negative for fatigue and weight loss Respiratory: negative for cough and wheezing Cardiovascular: negative for chest pain, fatigue and  palpitations Gastrointestinal: POSITIVE for chronic constipation Musculoskeletal:negative for myalgias Neurological: negative for gait problems and tremors Behavioral/Psych: negative for abusive relationship, depression Endocrine: negative for temperature intolerance    Genitourinary:negative for abnormal menstrual periods, genital lesions, hot flashes, sexual problems and vaginal discharge Integument/breast: negative for breast lump, breast tenderness, nipple discharge and skin lesion(s)    Objective:       BP 134/86   Pulse 80   Ht 5\' 5"  (1.651 m)   Wt 181 lb 12.8 oz (82.5 kg)   LMP 06/30/2017   BMI 30.25 kg/m  General:   alert  Skin:   no rash or abnormalities  Lungs:   clear to auscultation bilaterally  Heart:   regular rate and rhythm, S1, S2 normal, no murmur, click, rub or gallop  Breasts:   normal without suspicious masses, skin or nipple changes or axillary nodes  Abdomen:  normal findings: no organomegaly, soft, non-tender and no hernia  Pelvis:  External genitalia: normal general appearance Urinary system: urethral meatus normal and bladder without fullness, nontender Vaginal: normal without tenderness, induration or masses Cervix: normal appearance Adnexa: normal bimanual exam Uterus: anteverted and non-tender, normal size   Lab Review Urine pregnancy test Labs reviewed yes Radiologic studies reviewed yes  50% of 20 min visit spent on counseling and coordination of care.   Assessment:     1. Encounter for routine gynecological examination with Papanicolaou smear of cervix Rx: - Cytology - PAP  2. Dysmenorrhea Rx: - ibuprofen (ADVIL,MOTRIN) 800 MG tablet; Take 1 tablet (800 mg total) by mouth every 8 (eight) hours as needed.  Dispense: 30 tablet; Refill: 5  3. Health care maintenance Rx: - Prenatal Vit w/Fe-Methylfol-FA (PNV-SELECT) 27-0.6-0.4 MG TABS; Take 1 tablet by mouth daily before breakfast.  Dispense: 30 tablet; Refill: 11  4. Constipation by  delayed colonic transit Rx: - docusate sodium (COLACE) 100 MG capsule; Take 1 capsule (100 mg total) by mouth 2 (two) times daily.  Dispense: 60 capsule; Refill: 11 - polyethylene glycol (MIRALAX / GLYCOLAX) packet; Take 17 g by mouth at bedtime as needed.  Dispense: 30 each; Refill: 11    Plan:    Education reviewed: calcium supplements, depression evaluation, low fat, low cholesterol diet, safe sex/STD prevention, self breast exams and weight bearing exercise. Follow up in: 1 year.   Meds ordered this encounter  Medications  . ibuprofen (ADVIL,MOTRIN) 800 MG tablet    Sig: Take 1 tablet (800 mg total) by mouth every 8 (eight) hours as needed.    Dispense:  30 tablet    Refill:  5  .  Prenatal Vit w/Fe-Methylfol-FA (PNV-SELECT) 27-0.6-0.4 MG TABS    Sig: Take 1 tablet by mouth daily before breakfast.    Dispense:  30 tablet    Refill:  11  . docusate sodium (COLACE) 100 MG capsule    Sig: Take 1 capsule (100 mg total) by mouth 2 (two) times daily.    Dispense:  60 capsule    Refill:  11  . polyethylene glycol (MIRALAX / GLYCOLAX) packet    Sig: Take 17 g by mouth at bedtime as needed.    Dispense:  30 each    Refill:  11   No orders of the defined types were placed in this encounter.   Brock BadHARLES A. Elani Delph MD 07-12-2017

## 2017-07-13 ENCOUNTER — Other Ambulatory Visit: Payer: Self-pay | Admitting: Obstetrics

## 2017-07-13 DIAGNOSIS — B3731 Acute candidiasis of vulva and vagina: Secondary | ICD-10-CM

## 2017-07-13 DIAGNOSIS — B373 Candidiasis of vulva and vagina: Secondary | ICD-10-CM

## 2017-07-13 LAB — CERVICOVAGINAL ANCILLARY ONLY
Bacterial vaginitis: NEGATIVE
Candida vaginitis: POSITIVE — AB

## 2017-07-13 MED ORDER — FLUCONAZOLE 150 MG PO TABS: 150.0000 mg | ORAL_TABLET | Freq: Once | ORAL | 0 refills | Status: AC

## 2017-07-15 LAB — CYTOLOGY - PAP
HPV (WINDOPATH): DETECTED — AB
HPV 16/18/45 genotyping: NEGATIVE

## 2017-07-18 ENCOUNTER — Telehealth: Payer: Self-pay

## 2017-07-18 NOTE — Telephone Encounter (Signed)
Spoke with patient over the phone about her pap smear results and advised her that Dr. Clearance CootsHarper would like for her to come in for a colpo. Pt verbalized understanding. Transferred pt up to the front desk to schedule appointment for colposcopy.

## 2017-07-25 ENCOUNTER — Other Ambulatory Visit (HOSPITAL_COMMUNITY)
Admission: RE | Admit: 2017-07-25 | Discharge: 2017-07-25 | Disposition: A | Discharge status: Home / Self Care | Payer: BC Managed Care – PPO | Source: Ambulatory Visit | Attending: Obstetrics | Admitting: Obstetrics

## 2017-07-25 ENCOUNTER — Ambulatory Visit (INDEPENDENT_AMBULATORY_CARE_PROVIDER_SITE_OTHER): Payer: BC Managed Care – PPO | Admitting: Obstetrics

## 2017-07-25 ENCOUNTER — Other Ambulatory Visit: Payer: Self-pay | Admitting: Obstetrics

## 2017-07-25 ENCOUNTER — Encounter: Payer: Self-pay | Admitting: Obstetrics

## 2017-07-25 VITALS — BP 126/81 | HR 82 | Ht 65.0 in | Wt 179.4 lb

## 2017-07-25 DIAGNOSIS — N898 Other specified noninflammatory disorders of vagina: Secondary | ICD-10-CM | POA: Diagnosis not present

## 2017-07-25 DIAGNOSIS — Z3202 Encounter for pregnancy test, result negative: Secondary | ICD-10-CM

## 2017-07-25 DIAGNOSIS — B373 Candidiasis of vulva and vagina: Secondary | ICD-10-CM | POA: Diagnosis not present

## 2017-07-25 DIAGNOSIS — R87612 Low grade squamous intraepithelial lesion on cytologic smear of cervix (LGSIL): Secondary | ICD-10-CM | POA: Diagnosis not present

## 2017-07-25 DIAGNOSIS — N870 Mild cervical dysplasia: Secondary | ICD-10-CM | POA: Diagnosis not present

## 2017-07-25 LAB — POCT URINE PREGNANCY: PREG TEST UR: NEGATIVE

## 2017-07-25 NOTE — Addendum Note (Signed)
Addended by: Natale MilchSTALLING, BRITTANY D on: 07/25/2017 10:39 AM   Modules accepted: Orders

## 2017-07-25 NOTE — Progress Notes (Signed)
Patient is in the office for colpo procedure.  

## 2017-07-25 NOTE — Addendum Note (Signed)
Addended by: Coral CeoHARPER, Raeli Wiens A on: 07/25/2017 12:07 PM   Modules accepted: Orders

## 2017-07-25 NOTE — Progress Notes (Addendum)
Colposcopy Procedure Note  Indications: Pap smear 1 months ago showed: low-grade squamous intraepithelial neoplasia (LGSIL - encompassing HPV,mild dysplasia,CIN I). The prior pap showed low-grade squamous intraepithelial neoplasia (LGSIL - encompassing HPV,mild dysplasia,CIN I).  Prior cervical/vaginal disease: ? HGSIL at age 47, per patient. Prior cervical treatment: CRYOTHERAPY, per patient.  Procedure Details  The risks and benefits of the procedure and Written informed consent obtained.  A time-out was performed confirming the patient, procedure and allergy status  Speculum placed in vagina and excellent visualization of cervix achieved, cervix swabbed x 3 with acetic acid solution.  Findings: Cervix: no mosaicism, no punctation, no abnormal vasculature and acetowhite lesion(s) noted at 3, 6, and 9 o'clock; SCJ visualized 360 degrees without lesions, Benzocaine 20% spray applied to cervix, cervical biopsies taken at 3, 6, 9 and 12 o'clock, specimen labelled and sent to pathology and hemostasis achieved with silver nitrate.   Vaginal inspection: normal without visible lesions. Vulvar colposcopy: vulvar colposcopy not performed.      Specimens: ECC and Cervical Biopsies ( 4 )   Complications: none.  Plan: Specimens labelled and sent to Pathology. Will base further treatment on Pathology findings. Treatment options discussed with patient. Return to discuss Pathology results in 2 weeks.   Brock BadHARLES A. Ladana Chavero MD 02-25-2017

## 2017-07-26 LAB — CERVICOVAGINAL ANCILLARY ONLY
Bacterial vaginitis: NEGATIVE
CHLAMYDIA, DNA PROBE: NEGATIVE
Candida vaginitis: POSITIVE — AB
Neisseria Gonorrhea: NEGATIVE
Trichomonas: NEGATIVE

## 2017-07-27 ENCOUNTER — Other Ambulatory Visit: Payer: Self-pay | Admitting: Obstetrics

## 2017-07-27 DIAGNOSIS — B3731 Acute candidiasis of vulva and vagina: Secondary | ICD-10-CM

## 2017-07-27 DIAGNOSIS — B373 Candidiasis of vulva and vagina: Secondary | ICD-10-CM

## 2017-07-27 MED ORDER — FLUCONAZOLE 150 MG PO TABS: 150.0000 mg | ORAL_TABLET | Freq: Once | ORAL | 0 refills | Status: AC

## 2017-07-28 ENCOUNTER — Encounter: Payer: Self-pay | Admitting: *Deleted

## 2017-08-03 ENCOUNTER — Telehealth: Payer: Self-pay

## 2017-08-03 NOTE — Telephone Encounter (Signed)
Returned call, no answer, left vm 

## 2017-08-08 ENCOUNTER — Ambulatory Visit: Payer: Self-pay | Admitting: Obstetrics

## 2018-01-24 ENCOUNTER — Other Ambulatory Visit: Payer: Self-pay | Admitting: Internal Medicine

## 2018-01-24 DIAGNOSIS — Z1231 Encounter for screening mammogram for malignant neoplasm of breast: Secondary | ICD-10-CM

## 2018-02-20 ENCOUNTER — Ambulatory Visit
Admission: RE | Admit: 2018-02-20 | Discharge: 2018-02-20 | Disposition: A | Discharge status: Home / Self Care | Payer: BC Managed Care – PPO | Source: Ambulatory Visit | Attending: Internal Medicine | Admitting: Internal Medicine

## 2018-02-20 DIAGNOSIS — Z1231 Encounter for screening mammogram for malignant neoplasm of breast: Secondary | ICD-10-CM

## 2018-05-03 ENCOUNTER — Other Ambulatory Visit: Payer: Self-pay | Admitting: Obstetrics

## 2018-05-03 DIAGNOSIS — N946 Dysmenorrhea, unspecified: Secondary | ICD-10-CM

## 2018-06-16 ENCOUNTER — Other Ambulatory Visit: Payer: Self-pay | Admitting: Obstetrics

## 2018-06-16 DIAGNOSIS — N946 Dysmenorrhea, unspecified: Secondary | ICD-10-CM

## 2018-08-12 ENCOUNTER — Other Ambulatory Visit: Payer: Self-pay | Admitting: Obstetrics

## 2018-08-12 DIAGNOSIS — N946 Dysmenorrhea, unspecified: Secondary | ICD-10-CM

## 2018-10-10 ENCOUNTER — Encounter: Payer: Self-pay | Admitting: Obstetrics and Gynecology

## 2018-10-10 ENCOUNTER — Ambulatory Visit (INDEPENDENT_AMBULATORY_CARE_PROVIDER_SITE_OTHER): Payer: BC Managed Care – PPO | Admitting: Obstetrics and Gynecology

## 2018-10-10 ENCOUNTER — Other Ambulatory Visit (HOSPITAL_COMMUNITY)
Admission: RE | Admit: 2018-10-10 | Discharge: 2018-10-10 | Disposition: A | Discharge status: Home / Self Care | Payer: BC Managed Care – PPO | Source: Ambulatory Visit | Attending: Obstetrics and Gynecology | Admitting: Obstetrics and Gynecology

## 2018-10-10 ENCOUNTER — Other Ambulatory Visit: Payer: Self-pay

## 2018-10-10 DIAGNOSIS — R87619 Unspecified abnormal cytological findings in specimens from cervix uteri: Secondary | ICD-10-CM | POA: Insufficient documentation

## 2018-10-10 DIAGNOSIS — Z01419 Encounter for gynecological examination (general) (routine) without abnormal findings: Secondary | ICD-10-CM | POA: Insufficient documentation

## 2018-10-10 DIAGNOSIS — R87612 Low grade squamous intraepithelial lesion on cytologic smear of cervix (LGSIL): Secondary | ICD-10-CM

## 2018-10-10 DIAGNOSIS — K5909 Other constipation: Secondary | ICD-10-CM

## 2018-10-10 NOTE — Progress Notes (Signed)
Patient ID: Chelsea Colon, female   DOB: 11/03/1970, 48 y.o.   MRN: [id number]  Chelsea Colon is a 48 y.o. [id number] female here for a routine annual gynecologic exam. She has no GYN complaints. Still problems with constipation. 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 Cryo by pt but no office note. She denies any AUB, vaginal discharge or problems with IC.    Gynecologic History Patient's last menstrual period was 09/20/2018 (exact date). Contraception: tubal ligation Last Pap: 7/19. Results were: abnormal Last mammogram: 07/2018. Results were: normal  Obstetric History OB History  Gravida Para Term Preterm AB Living  5 5 5     5   SAB TAB Ectopic Multiple Live Births        0 5    # Outcome Date GA Lbr Len/2nd Weight Sex Delivery Anes PTL Lv  5 Term 11/23/14 [redacted]w[redacted]d 14:45 / 00:58 7 lb 2.8 oz (3.255 kg) M VBAC EPI  LIV  4 Term 2008 [redacted]w[redacted]d  6 lb 8 oz (2.948 kg) F Vag-Spont   LIV  3 Term 2004   7 lb 1 oz (3.204 kg) F Vag-Spont   LIV  2 Term 1994 [redacted]w[redacted]d  5 lb 14 oz (2.665 kg) F Vag-Spont   LIV  1 Term 1991   5 lb 4 oz (2.381 kg) F CS-LTranv   LIV    Past Medical History:  Diagnosis Date  . HPV (human papilloma virus) infection   . Hx of chlamydia infection 2004   Treated  . Hypertension    Chronic  . Hyperthyroidism   . Tubal ligation status 11/23/2014  . Vaginal Pap smear, abnormal     Past Surgical History:  Procedure Laterality Date  . ABDOMINAL SURGERY    . CESAREAN SECTION    . GYNECOLOGIC CRYOSURGERY    . HERNIA REPAIR    . TUBAL LIGATION Bilateral 11/23/2014   Procedure: POST PARTUM TUBAL LIGATION;  Surgeon: 11/25/2014, MD;  Location: [facility];  Service: Gynecology;  Laterality: Bilateral;    Current Outpatient Medications on File Prior to Visit  Medication Sig Dispense Refill  . cetirizine (ZYRTEC) 5 MG tablet Take 5 mg by mouth daily.    . diclofenac (VOLTAREN) 75 MG EC tablet Take 75 mg by mouth 2 (two) times daily.    . hydrochlorothiazide (HYDRODIURIL) 25 MG  tablet Take 25 mg by mouth daily.    Kathreen Cosier ibuprofen (ADVIL) 800 MG tablet TAKE 1 TABLET BY MOUTH EVERY 8 HOURS AS NEEDED 30 tablet 5  . LINZESS 145 MCG CAPS capsule TAKE 1 CAPSULE BY MOUTH DAILY IN THE MORNING    . methimazole (TAPAZOLE) 10 MG tablet Take 10 mg by mouth 3 (three) times daily.    . Vitamin D, Ergocalciferol, (DRISDOL) 1.25 MG (50000 UT) CAPS capsule      No current facility-administered medications on file prior to visit.     Allergies  Allergen Reactions  . Lemon Extract [Flavoring Agent]     Lips swell    Social History   Socioeconomic History  . Marital status: Single    Spouse name: Not on file  . Number of children: Not on file  . Years of education: Not on file  . Highest education level: Not on file  Occupational History  . Not on file  Social Needs  . Financial resource strain: Not on file  . Food insecurity    Worry: Not on file    Inability: Not on file  .  Transportation needs    Medical: Not on file    Non-medical: Not on file  Tobacco Use  . Smoking status: Never Smoker  . Smokeless tobacco: Never Used  Substance and Sexual Activity  . Alcohol use: No  . Drug use: No  . Sexual activity: Yes    Birth control/protection: Surgical  Lifestyle  . Physical activity    Days per week: Not on file    Minutes per session: Not on file  . Stress: Not on file  Relationships  . Social Herbalist on phone: Not on file    Gets together: Not on file    Attends religious service: Not on file    Active member of club or organization: Not on file    Attends meetings of clubs or organizations: Not on file    Relationship status: Not on file  . Intimate partner violence    Fear of current or ex partner: Not on file    Emotionally abused: Not on file    Physically abused: Not on file    Forced sexual activity: Not on file  Other Topics Concern  . Not on file  Social History Narrative  . Not on file    Family History  Problem Relation Age  of Onset  . Hypertension Mother   . Diabetes Mother   . Heart disease Mother   . Asthma Daughter   . Breast cancer Paternal Aunt     The following portions of the patient's history were reviewed and updated as appropriate: allergies, current medications, past family history, past medical history, past social history, past surgical history and problem list.  Review of Systems Pertinent items noted in HPI and remainder of comprehensive ROS otherwise negative.   Objective:  BP 137/87   Pulse 86   Ht 5\' 4"  (1.626 m)   Wt 188 lb 4.8 oz (85.4 kg)   LMP 09/20/2018 (Exact Date)   BMI 32.32 kg/m  CONSTITUTIONAL: Well-developed, well-nourished female in no acute distress.  HENT:  Normocephalic, atraumatic, External right and left ear normal. Oropharynx is clear and moist EYES: Conjunctivae and EOM are normal. Pupils are equal, round, and reactive to light. No scleral icterus.  NECK: Normal range of motion, supple, no masses.  Normal thyroid.  SKIN: Skin is warm and dry. No rash noted. Not diaphoretic. No erythema. No pallor. Misenheimer: Alert and oriented to person, place, and time. Normal reflexes, muscle tone coordination. No cranial nerve deficit noted. PSYCHIATRIC: Normal mood and affect. Normal behavior. Normal judgment and thought content. CARDIOVASCULAR: Normal heart rate noted, regular rhythm RESPIRATORY: Clear to auscultation bilaterally. Effort and breath sounds normal, no problems with respiration noted. BREASTS: Symmetric in size. No masses, skin changes, nipple drainage, or lymphadenopathy. ABDOMEN: Soft, normal bowel sounds, no distention noted.  No tenderness, rebound or guarding.  PELVIC: Normal appearing external genitalia; normal appearing vaginal mucosa and cervix.  No abnormal discharge noted.  Pap smear obtained.  Normal uterine size, no other palpable masses, no uterine or adnexal tenderness. MUSCULOSKELETAL: Normal range of motion. No tenderness.  No cyanosis, clubbing,  or edema.  2+ distal pulses.   Assessment:  Annual gynecologic examination with pap smear H/O abnormal pap smear Chronic constipation  Plan:  Will follow up results of pap smear and manage accordingly. To see PCP for chronic constipation Routine preventative health maintenance measures emphasized. Please refer to After Visit Summary for other counseling recommendations.    Chancy Milroy, MD, [facility] Attending Obstetrician &  Adult nurse for [employer], [place]

## 2018-10-10 NOTE — Progress Notes (Addendum)
GYN presents for AEX/PAP.  Had abnormal PAP 07/2017.  Tubal done 2016. C/o chronic constipation that is getting worse x 2+ years, Linzess does not work. Declined FLU vaccine.  Last Mammogram 02/2018

## 2018-10-10 NOTE — Patient Instructions (Signed)
Health Maintenance, Female Adopting a healthy lifestyle and getting preventive care are important in promoting health and wellness. Ask your health care provider about:  The right schedule for you to have regular tests and exams.  Things you can do on your own to prevent diseases and keep yourself healthy. What should I know about diet, weight, and exercise? Eat a healthy diet   Eat a diet that includes plenty of vegetables, fruits, low-fat dairy products, and lean protein.  Do not eat a lot of foods that are high in solid fats, added sugars, or sodium. Maintain a healthy weight Body mass index (BMI) is used to identify weight problems. It estimates body fat based on height and weight. Your health care provider can help determine your BMI and help you achieve or maintain a healthy weight. Get regular exercise Get regular exercise. This is one of the most important things you can do for your health. Most adults should:  Exercise for at least 150 minutes each week. The exercise should increase your heart rate and make you sweat (moderate-intensity exercise).  Do strengthening exercises at least twice a week. This is in addition to the moderate-intensity exercise.  Spend less time sitting. Even light physical activity can be beneficial. Watch cholesterol and blood lipids Have your blood tested for lipids and cholesterol at 48 years of age, then have this test every 5 years. Have your cholesterol levels checked more often if:  Your lipid or cholesterol levels are high.  You are older than 48 years of age.  You are at high risk for heart disease. What should I know about cancer screening? Depending on your health history and family history, you may need to have cancer screening at various ages. This may include screening for:  Breast cancer.  Cervical cancer.  Colorectal cancer.  Skin cancer.  Lung cancer. What should I know about heart disease, diabetes, and high blood  pressure? Blood pressure and heart disease  High blood pressure causes heart disease and increases the risk of stroke. This is more likely to develop in people who have high blood pressure readings, are of African descent, or are overweight.  Have your blood pressure checked: ? Every 3-5 years if you are 18-39 years of age. ? Every year if you are 40 years old or older. Diabetes Have regular diabetes screenings. This checks your fasting blood sugar level. Have the screening done:  Once every three years after age 40 if you are at a normal weight and have a low risk for diabetes.  More often and at a younger age if you are overweight or have a high risk for diabetes. What should I know about preventing infection? Hepatitis B If you have a higher risk for hepatitis B, you should be screened for this virus. Talk with your health care provider to find out if you are at risk for hepatitis B infection. Hepatitis C Testing is recommended for:  Everyone born from 1945 through 1965.  Anyone with known risk factors for hepatitis C. Sexually transmitted infections (STIs)  Get screened for STIs, including gonorrhea and chlamydia, if: ? You are sexually active and are younger than 48 years of age. ? You are older than 48 years of age and your health care provider tells you that you are at risk for this type of infection. ? Your sexual activity has changed since you were last screened, and you are at increased risk for chlamydia or gonorrhea. Ask your health care provider if   you are at risk.  Ask your health care provider about whether you are at high risk for HIV. Your health care provider may recommend a prescription medicine to help prevent HIV infection. If you choose to take medicine to prevent HIV, you should first get tested for HIV. You should then be tested every 3 months for as long as you are taking the medicine. Pregnancy  If you are about to stop having your period (premenopausal) and  you may become pregnant, seek counseling before you get pregnant.  Take 400 to 800 micrograms (mcg) of folic acid every day if you become pregnant.  Ask for birth control (contraception) if you want to prevent pregnancy. Osteoporosis and menopause Osteoporosis is a disease in which the bones lose minerals and strength with aging. This can result in bone fractures. If you are 65 years old or older, or if you are at risk for osteoporosis and fractures, ask your health care provider if you should:  Be screened for bone loss.  Take a calcium or vitamin D supplement to lower your risk of fractures.  Be given hormone replacement therapy (HRT) to treat symptoms of menopause. Follow these instructions at home: Lifestyle  Do not use any products that contain nicotine or tobacco, such as cigarettes, e-cigarettes, and chewing tobacco. If you need help quitting, ask your health care provider.  Do not use street drugs.  Do not share needles.  Ask your health care provider for help if you need support or information about quitting drugs. Alcohol use  Do not drink alcohol if: ? Your health care provider tells you not to drink. ? You are pregnant, may be pregnant, or are planning to become pregnant.  If you drink alcohol: ? Limit how much you use to 0-1 drink a day. ? Limit intake if you are breastfeeding.  Be aware of how much alcohol is in your drink. In the [place] one drink equals one 12 oz bottle of beer (355 mL), one 5 oz glass of wine (148 mL), or one 1 oz glass of hard liquor (44 mL). General instructions  Schedule regular health, dental, and eye exams.  Stay current with your vaccines.  Tell your health care provider if: ? You often feel depressed. ? You have ever been abused or do not feel safe at home. Summary  Adopting a healthy lifestyle and getting preventive care are important in promoting health and wellness.  Follow your health care provider's instructions about healthy  diet, exercising, and getting tested or screened for diseases.  Follow your health care provider's instructions on monitoring your cholesterol and blood pressure. This information is not intended to replace advice given to you by your health care provider. Make sure you discuss any questions you have with your health care provider. Document Released: 07/06/2010 Document Revised: 12/14/2017 Document Reviewed: 12/14/2017 Elsevier Patient Education  2020 Elsevier Inc.  

## 2018-10-17 LAB — CYTOLOGY - PAP
Comment: NEGATIVE
Comment: NEGATIVE
Comment: NEGATIVE
HPV 16: NEGATIVE
HPV 18 / 45: NEGATIVE
High risk HPV: POSITIVE — AB

## 2018-11-09 ENCOUNTER — Encounter: Payer: BC Managed Care – PPO | Admitting: Obstetrics and Gynecology

## 2018-11-15 ENCOUNTER — Encounter: Payer: BC Managed Care – PPO | Admitting: Obstetrics and Gynecology

## 2018-11-28 ENCOUNTER — Encounter: Payer: BC Managed Care – PPO | Admitting: Obstetrics and Gynecology

## 2019-01-10 ENCOUNTER — Other Ambulatory Visit: Payer: Self-pay

## 2019-01-10 ENCOUNTER — Encounter: Payer: Self-pay | Admitting: Obstetrics and Gynecology

## 2019-01-10 ENCOUNTER — Ambulatory Visit (INDEPENDENT_AMBULATORY_CARE_PROVIDER_SITE_OTHER): Payer: BC Managed Care – PPO | Admitting: Obstetrics and Gynecology

## 2019-01-10 ENCOUNTER — Other Ambulatory Visit (HOSPITAL_COMMUNITY)
Admission: RE | Admit: 2019-01-10 | Discharge: 2019-01-10 | Disposition: A | Discharge status: Home / Self Care | Payer: BC Managed Care – PPO | Source: Ambulatory Visit | Attending: Obstetrics and Gynecology | Admitting: Obstetrics and Gynecology

## 2019-01-10 VITALS — BP 137/79 | HR 83 | Wt 188.5 lb

## 2019-01-10 DIAGNOSIS — Z3202 Encounter for pregnancy test, result negative: Secondary | ICD-10-CM | POA: Diagnosis not present

## 2019-01-10 DIAGNOSIS — R87612 Low grade squamous intraepithelial lesion on cytologic smear of cervix (LGSIL): Secondary | ICD-10-CM | POA: Insufficient documentation

## 2019-01-10 LAB — POCT URINE PREGNANCY: Preg Test, Ur: NEGATIVE

## 2019-01-10 NOTE — Progress Notes (Signed)
Patient ID: Deonne Almetta Lovely, female   DOB: Dec 09, 1970, 49 y.o.   MRN: [id number]    GYNECOLOGY CLINIC COLPOSCOPY PROCEDURE NOTE  49 y.o. [id number] here for colposcopy for low-grade squamous intraepithelial neoplasia (LGSIL - encompassing HPV,mild dysplasia,CIN I) pap smear on 10/2018. Discussed role for HPV in cervical dysplasia, need for surveillance. H/O LGSIL in the past with cryo to cervix  Patient given informed consent, signed copy in the chart, time out was performed.  Placed in lithotomy position. Cervix viewed with speculum and colposcope after application of acetic acid.   Colposcopy adequate? Yes  acetowhite lesion(s) noted, plus vascular areas,  corresponding biopsies obtained.  ECC specimen obtained. Monsels applied All specimens were labelled and sent to pathology.   Patient was given post procedure instructions.  Will follow up pathology and manage accordingly.  Routine preventative health maintenance measures emphasized.    Nettie Elm, MD, FACOG Attending Obstetrician & Gynecologist Center for [facility], [facility]

## 2019-01-10 NOTE — Progress Notes (Signed)
Pt is here for colpo procedure. Abnormal pap smear LSIL and positive high risk HPV on 10/10/18

## 2019-01-10 NOTE — Patient Instructions (Signed)
Colposcopy, Care After This sheet gives you information about how to care for yourself after your procedure. Your doctor may also give you more specific instructions. If you have problems or questions, contact your doctor. What can I expect after the procedure? If you did not have a tissue sample removed (did not have a biopsy), you may only have some spotting for a few days. You can go back to your normal activities. If you had a tissue sample removed, it is common to have:  Soreness and pain. This may last for a few days.  Light-headedness.  Mild bleeding from your vagina or dark-colored, grainy discharge from your vagina. This may last for a few days. You may need to wear a sanitary pad.  Spotting for at least 48 hours after the procedure. Follow these instructions at home:   Take over-the-counter and prescription medicines only as told by your doctor. Ask your doctor what medicines you can start taking again. This is very important if you take blood-thinning medicine.  Do not drive or use heavy machinery while taking prescription pain medicine.  For 3 days, or as long as your doctor tells you, avoid: ? Douching. ? Using tampons. ? Having sex.  If you use birth control (contraception), keep using it.  Limit activity for the first day after the procedure. Ask your doctor what activities are safe for you.  It is up to you to get the results of your procedure. Ask your doctor when your results will be ready.  Keep all follow-up visits as told by your doctor. This is important. Contact a doctor if:  You get a skin rash. Get help right away if:  You are bleeding a lot from your vagina. It is a lot of bleeding if you are using more than one pad an hour for 2 hours in a row.  You have clumps of blood (blood clots) coming from your vagina.  You have a fever.  You have chills  You have pain in your lower belly (pelvic area).  You have signs of infection, such as vaginal  discharge that is: ? Different than usual. ? Yellow. ? Bad-smelling.  You have very pain or cramps in your lower belly that do not get better with medicine.  You feel light-headed.  You feel dizzy.  You pass out (faint). Summary  If you did not have a tissue sample removed (did not have a biopsy), you may only have some spotting for a few days. You can go back to your normal activities.  If you had a tissue sample removed, it is common to have mild pain and spotting for 48 hours.  For 3 days, or as long as your doctor tells you, avoid douching, using tampons and having sex.  Get help right away if you have bleeding, very bad pain, or signs of infection. This information is not intended to replace advice given to you by your health care provider. Make sure you discuss any questions you have with your health care provider. Document Revised: 12/03/2016 Document Reviewed: 09/10/2015 Elsevier Patient Education  2020 Elsevier Inc.  

## 2019-01-15 LAB — SURGICAL PATHOLOGY

## 2019-01-18 ENCOUNTER — Other Ambulatory Visit: Payer: Self-pay | Admitting: Internal Medicine

## 2019-01-18 DIAGNOSIS — Z1231 Encounter for screening mammogram for malignant neoplasm of breast: Secondary | ICD-10-CM

## 2019-02-27 ENCOUNTER — Ambulatory Visit: Payer: BC Managed Care – PPO

## 2019-03-13 ENCOUNTER — Ambulatory Visit: Payer: BC Managed Care – PPO

## 2019-03-16 ENCOUNTER — Ambulatory Visit: Payer: BC Managed Care – PPO | Attending: Internal Medicine

## 2019-03-16 DIAGNOSIS — Z20822 Contact with and (suspected) exposure to covid-19: Secondary | ICD-10-CM

## 2019-03-17 LAB — NOVEL CORONAVIRUS, NAA: SARS-CoV-2, NAA: NOT DETECTED

## 2019-03-21 ENCOUNTER — Ambulatory Visit: Payer: BC Managed Care – PPO

## 2019-04-12 ENCOUNTER — Ambulatory Visit
Admission: RE | Admit: 2019-04-12 | Discharge: 2019-04-12 | Disposition: A | Discharge status: Home / Self Care | Payer: BC Managed Care – PPO | Source: Ambulatory Visit | Attending: Internal Medicine | Admitting: Internal Medicine

## 2019-04-12 ENCOUNTER — Other Ambulatory Visit: Payer: Self-pay

## 2019-04-12 DIAGNOSIS — Z1231 Encounter for screening mammogram for malignant neoplasm of breast: Secondary | ICD-10-CM

## 2019-04-13 ENCOUNTER — Other Ambulatory Visit: Payer: Self-pay | Admitting: Internal Medicine

## 2019-04-13 DIAGNOSIS — R928 Other abnormal and inconclusive findings on diagnostic imaging of breast: Secondary | ICD-10-CM

## 2019-04-20 ENCOUNTER — Ambulatory Visit
Admission: RE | Admit: 2019-04-20 | Discharge: 2019-04-20 | Disposition: A | Discharge status: Home / Self Care | Payer: BC Managed Care – PPO | Source: Ambulatory Visit | Attending: Internal Medicine | Admitting: Internal Medicine

## 2019-04-20 ENCOUNTER — Other Ambulatory Visit: Payer: Self-pay

## 2019-04-20 DIAGNOSIS — R928 Other abnormal and inconclusive findings on diagnostic imaging of breast: Secondary | ICD-10-CM

## 2019-04-25 ENCOUNTER — Other Ambulatory Visit: Payer: BC Managed Care – PPO

## 2020-03-05 ENCOUNTER — Other Ambulatory Visit: Payer: Self-pay | Admitting: Internal Medicine

## 2020-03-06 LAB — COMPLETE METABOLIC PANEL WITH GFR
AG Ratio: 1.4 (calc) (ref 1.0–2.5)
ALT: 19 U/L (ref 6–29)
AST: 16 U/L (ref 10–35)
Albumin: 4.2 g/dL (ref 3.6–5.1)
Alkaline phosphatase (APISO): 55 U/L (ref 31–125)
BUN: 13 mg/dL (ref 7–25)
CO2: 26 mmol/L (ref 20–32)
Calcium: 9.4 mg/dL (ref 8.6–10.2)
Chloride: 103 mmol/L (ref 98–110)
Creat: 0.63 mg/dL (ref 0.50–1.10)
GFR, Est African American: 122 mL/min/{1.73_m2} (ref >=60)
GFR, Est Non African American: 105 mL/min/{1.73_m2} (ref >=60)
Globulin: 2.9 g/dL (calc) (ref 1.9–3.7)
Glucose, Bld: 80 mg/dL (ref 65–99)
Potassium: 4.3 mmol/L (ref 3.5–5.3)
Sodium: 139 mmol/L (ref 135–146)
Total Bilirubin: 0.3 mg/dL (ref 0.2–1.2)
Total Protein: 7.1 g/dL (ref 6.1–8.1)

## 2020-03-06 LAB — CBC
HCT: 38.1 % (ref 35.0–45.0)
Hemoglobin: 12.6 g/dL (ref 11.7–15.5)
MCH: 26 pg — ABNORMAL LOW (ref 27.0–33.0)
MCHC: 33.1 g/dL (ref 32.0–36.0)
MCV: 78.7 fL — ABNORMAL LOW (ref 80.0–100.0)
MPV: 10.3 fL (ref 7.5–12.5)
Platelets: 381 10*3/uL (ref 140–400)
RBC: 4.84 10*6/uL (ref 3.80–5.10)
RDW: 14.3 % (ref 11.0–15.0)
WBC: 8.6 10*3/uL (ref 3.8–10.8)

## 2020-03-06 LAB — T4, FREE: Free T4: 0.6 ng/dL — ABNORMAL LOW (ref 0.8–1.8)

## 2020-03-06 LAB — LIPID PANEL
Cholesterol: 190 mg/dL (ref <200)
HDL: 59 mg/dL (ref >=50)
LDL Cholesterol (Calc): 108 mg/dL (calc) — ABNORMAL HIGH
Non-HDL Cholesterol (Calc): 131 mg/dL (calc) — ABNORMAL HIGH (ref <130)
Total CHOL/HDL Ratio: 3.2 (calc) (ref <5.0)
Triglycerides: 120 mg/dL (ref <150)

## 2020-03-06 LAB — T3: T3, Total: 145 ng/dL (ref 76–181)

## 2020-03-06 LAB — VITAMIN D 25 HYDROXY (VIT D DEFICIENCY, FRACTURES): Vit D, 25-Hydroxy: 68 ng/mL (ref 30–100)

## 2020-03-06 LAB — T3 UPTAKE: T3 Uptake: 23 % (ref 22–35)

## 2020-03-06 LAB — TSH: TSH: 0.03 mIU/L — ABNORMAL LOW

## 2020-03-12 ENCOUNTER — Other Ambulatory Visit: Payer: Self-pay | Admitting: Internal Medicine

## 2020-03-12 DIAGNOSIS — Z1231 Encounter for screening mammogram for malignant neoplasm of breast: Secondary | ICD-10-CM

## 2020-03-19 ENCOUNTER — Other Ambulatory Visit (HOSPITAL_COMMUNITY)
Admission: RE | Admit: 2020-03-19 | Discharge: 2020-03-19 | Disposition: A | Discharge status: Home / Self Care | Payer: Medicaid Other | Source: Ambulatory Visit | Attending: Obstetrics | Admitting: Obstetrics

## 2020-03-19 ENCOUNTER — Other Ambulatory Visit: Payer: Self-pay

## 2020-03-19 ENCOUNTER — Ambulatory Visit (INDEPENDENT_AMBULATORY_CARE_PROVIDER_SITE_OTHER): Payer: BC Managed Care – PPO | Admitting: Obstetrics

## 2020-03-19 ENCOUNTER — Encounter: Payer: Self-pay | Admitting: Obstetrics

## 2020-03-19 VITALS — BP 129/85 | HR 61 | Ht 65.0 in | Wt 186.0 lb

## 2020-03-19 DIAGNOSIS — Z01419 Encounter for gynecological examination (general) (routine) without abnormal findings: Secondary | ICD-10-CM | POA: Diagnosis present

## 2020-03-19 DIAGNOSIS — N898 Other specified noninflammatory disorders of vagina: Secondary | ICD-10-CM

## 2020-03-19 NOTE — Progress Notes (Signed)
Patient presents for Annual Exam today.  LMP: 03/11/2020 Last pap: 10/10/2018 +HPV LSIL  Contraception: BTL  STD Screening: Declines  Mammogram: 04/2019 Family Hx of Breast Cancer: P.Aunt   CC: None

## 2020-03-19 NOTE — Progress Notes (Signed)
Subjective:        Chelsea Colon is a 50 y.o. female here for a routine exam.  Current complaints: Vaginal discharge.    Personal health questionnaire:  Is patient [name], have a family history of breast and/or ovarian cancer: no Is there a family history of uterine cancer diagnosed at age < 52, gastrointestinal cancer, urinary tract cancer, family member who is a Personnel officer syndrome-associated carrier: no Is the patient overweight and hypertensive, family history of diabetes, personal history of gestational diabetes, preeclampsia or PCOS: no Is patient over 61, have PCOS,  family history of premature CHD under age 65, diabetes, smoke, have hypertension or peripheral artery disease:  no At any time, has a partner hit, kicked or otherwise hurt or frightened you?: no Over the past 2 weeks, have you felt down, depressed or hopeless?: no Over the past 2 weeks, have you felt little interest or pleasure in doing things?:no   Gynecologic History Patient's last menstrual period was 03/11/2020 (exact date). Contraception: tubal ligation Last Pap: 01-10-2019. Results were: LGSIL Last mammogram: 04-12-2019. Results were: abnormal  Obstetric History OB History  Gravida Para Term Preterm AB Living  5 5 5     5   SAB IAB Ectopic Multiple Live Births        0 5    # Outcome Date GA Lbr Len/2nd Weight Sex Delivery Anes PTL Lv  5 Term 11/23/14 [redacted]w[redacted]d 14:45 / 00:58 7 lb 2.8 oz (3.255 kg) M VBAC EPI  LIV  4 Term 2008 [redacted]w[redacted]d  6 lb 8 oz (2.948 kg) F Vag-Spont   LIV  3 Term 2004   7 lb 1 oz (3.204 kg) F Vag-Spont   LIV  2 Term 1994 [redacted]w[redacted]d  5 lb 14 oz (2.665 kg) F Vag-Spont   LIV  1 Term 1991   5 lb 4 oz (2.381 kg) F CS-LTranv   LIV    Past Medical History:  Diagnosis Date  . HPV (human papilloma virus) infection   . Hx of chlamydia infection 2004   Treated  . Hypertension    Chronic  . Hyperthyroidism   . Tubal ligation status 11/23/2014  . Vaginal Pap smear, abnormal     Past  Surgical History:  Procedure Laterality Date  . ABDOMINAL SURGERY    . CESAREAN SECTION    . GYNECOLOGIC CRYOSURGERY    . HERNIA REPAIR    . TUBAL LIGATION Bilateral 11/23/2014   Procedure: POST PARTUM TUBAL LIGATION;  Surgeon: 11/25/2014, MD;  Location: [facility];  Service: Gynecology;  Laterality: Bilateral;     Current Outpatient Medications:  .  cetirizine (ZYRTEC) 5 MG tablet, Take 5 mg by mouth daily., Disp: , Rfl:  .  diclofenac (VOLTAREN) 75 MG EC tablet, Take 75 mg by mouth 2 (two) times daily., Disp: , Rfl:  .  FeFum-FePoly-FA-B Cmp-C-Biot (INTEGRA PLUS) CAPS, Take 1 capsule by mouth daily., Disp: , Rfl:  .  hydrochlorothiazide (HYDRODIURIL) 25 MG tablet, Take 25 mg by mouth daily., Disp: , Rfl:  .  ibuprofen (ADVIL) 800 MG tablet, TAKE 1 TABLET BY MOUTH EVERY 8 HOURS AS NEEDED, Disp: 30 tablet, Rfl: 5 .  LINZESS 145 MCG CAPS capsule, TAKE 1 CAPSULE BY MOUTH DAILY IN THE MORNING, Disp: , Rfl:  .  methimazole (TAPAZOLE) 10 MG tablet, Take 10 mg by mouth 3 (three) times daily., Disp: , Rfl:  .  Vitamin D, Ergocalciferol, (DRISDOL) 1.25 MG (50000 UT) CAPS capsule, , Disp: ,  Rfl:  Allergies  Allergen Reactions  . Lemon Extract [Flavoring Agent]     Lips swell    Social History   Tobacco Use  . Smoking status: Never Smoker  . Smokeless tobacco: Never Used  Substance Use Topics  . Alcohol use: No    Family History  Problem Relation Age of Onset  . Hypertension Mother   . Diabetes Mother   . Heart disease Mother   . Asthma Daughter   . Breast cancer Paternal Aunt       Review of Systems  Constitutional: negative for fatigue and weight loss Respiratory: negative for cough and wheezing Cardiovascular: negative for chest pain, fatigue and palpitations Gastrointestinal: negative for abdominal pain and change in bowel habits Musculoskeletal:negative for myalgias Neurological: negative for gait problems and tremors Behavioral/Psych: negative for abusive  relationship, depression Endocrine: negative for temperature intolerance    Genitourinary:negative for abnormal menstrual periods, genital lesions, hot flashes, sexual problems.  Positive for vaginal discharge Integument/breast: negative for breast lump, breast tenderness, nipple discharge and skin lesion(s)    Objective:       BP 129/85   Pulse 61   Ht 5\' 5"  (1.651 m)   Wt 186 lb (84.4 kg)   LMP 03/11/2020 (Exact Date)   BMI 30.95 kg/m  General:   alert and no distress  Skin:   no rash or abnormalities  Lungs:   clear to auscultation bilaterally  Heart:   regular rate and rhythm, S1, S2 normal, no murmur, click, rub or gallop  Breasts:   normal without suspicious masses, skin or nipple changes or axillary nodes  Abdomen:  normal findings: no organomegaly, soft, non-tender and no hernia  Pelvis:  External genitalia: normal general appearance Urinary system: urethral meatus normal and bladder without fullness, nontender Vaginal: normal without tenderness, induration or masses Cervix: normal appearance Adnexa: normal bimanual exam Uterus: anteverted and non-tender, normal size   Lab Review Urine pregnancy test Labs reviewed yes Radiologic studies reviewed yes  50% of 20 min visit spent on counseling and coordination of care.   Assessment:     1. Encounter for gynecological examination with Papanicolaou smear of cervix Rx: - Cytology - PAP  2. Vaginal discharge Rx: - Cervicovaginal ancillary only( Gantt)     Plan:    Education reviewed: calcium supplements, depression evaluation, low fat, low cholesterol diet, safe sex/STD prevention, self breast exams and weight bearing exercise. Follow up in: 1 year.    Orders Placed This Encounter  Procedures  . RPR+HBsAg+HCVAb+...    05/11/2020, MD 03/19/2020 9:06 AM

## 2020-03-20 LAB — CERVICOVAGINAL ANCILLARY ONLY
Bacterial Vaginitis (gardnerella): NEGATIVE
Candida Glabrata: NEGATIVE
Candida Vaginitis: POSITIVE — AB
Chlamydia: NEGATIVE
Comment: NEGATIVE
Comment: NEGATIVE
Comment: NEGATIVE
Comment: NEGATIVE
Comment: NEGATIVE
Comment: NORMAL
Neisseria Gonorrhea: NEGATIVE
Trichomonas: NEGATIVE

## 2020-03-21 ENCOUNTER — Telehealth: Payer: Self-pay

## 2020-03-21 ENCOUNTER — Other Ambulatory Visit: Payer: Self-pay | Admitting: Obstetrics

## 2020-03-21 DIAGNOSIS — B373 Candidiasis of vulva and vagina: Secondary | ICD-10-CM

## 2020-03-21 DIAGNOSIS — B3731 Acute candidiasis of vulva and vagina: Secondary | ICD-10-CM

## 2020-03-21 MED ORDER — FLUCONAZOLE 150 MG PO TABS: 150.0000 mg | ORAL_TABLET | Freq: Once | ORAL | 0 refills | Status: AC

## 2020-03-21 NOTE — Telephone Encounter (Signed)
-----   Message from Brock Bad, MD sent at 03/21/2020  8:00 AM EDT ----- Diflucan Rx for yeast

## 2020-03-21 NOTE — Telephone Encounter (Signed)
Pt called and given lab results. Pt verbalizes understanding and has no further questions at this time.

## 2020-03-24 LAB — CYTOLOGY - PAP
Comment: NEGATIVE
Comment: NEGATIVE
Diagnosis: UNDETERMINED — AB
HPV 16: NEGATIVE
HPV 18 / 45: NEGATIVE
High risk HPV: POSITIVE — AB

## 2020-04-17 ENCOUNTER — Encounter: Payer: Medicaid Other | Admitting: Obstetrics

## 2020-04-30 ENCOUNTER — Encounter: Payer: Medicaid Other | Admitting: Obstetrics

## 2020-05-06 ENCOUNTER — Ambulatory Visit: Payer: Medicaid Other

## 2020-05-28 ENCOUNTER — Encounter: Payer: Medicaid Other | Admitting: Obstetrics

## 2020-06-18 ENCOUNTER — Ambulatory Visit
Admission: RE | Admit: 2020-06-18 | Discharge: 2020-06-18 | Disposition: A | Discharge status: Home / Self Care | Payer: BC Managed Care – PPO | Source: Ambulatory Visit | Attending: Internal Medicine | Admitting: Internal Medicine

## 2020-06-18 ENCOUNTER — Other Ambulatory Visit: Payer: Self-pay

## 2020-06-18 DIAGNOSIS — Z1231 Encounter for screening mammogram for malignant neoplasm of breast: Secondary | ICD-10-CM

## 2020-06-30 ENCOUNTER — Other Ambulatory Visit: Payer: Self-pay

## 2020-06-30 ENCOUNTER — Ambulatory Visit: Payer: BC Managed Care – PPO | Admitting: Obstetrics and Gynecology

## 2020-06-30 ENCOUNTER — Other Ambulatory Visit (HOSPITAL_COMMUNITY)
Admission: RE | Admit: 2020-06-30 | Discharge: 2020-06-30 | Disposition: A | Discharge status: Home / Self Care | Payer: BC Managed Care – PPO | Source: Ambulatory Visit | Attending: Obstetrics and Gynecology | Admitting: Obstetrics and Gynecology

## 2020-06-30 DIAGNOSIS — R87810 Cervical high risk human papillomavirus (HPV) DNA test positive: Secondary | ICD-10-CM

## 2020-06-30 DIAGNOSIS — Z01812 Encounter for preprocedural laboratory examination: Secondary | ICD-10-CM | POA: Diagnosis not present

## 2020-06-30 DIAGNOSIS — R87610 Atypical squamous cells of undetermined significance on cytologic smear of cervix (ASC-US): Secondary | ICD-10-CM | POA: Insufficient documentation

## 2020-06-30 LAB — POCT URINE PREGNANCY: Preg Test, Ur: NEGATIVE

## 2020-06-30 NOTE — Progress Notes (Signed)
50 yo G5P5 seen at [facility] clinic for ASCUS pap with positive high risk HPV.  Pt has hx of LGSIL along with cryosurgery for treatment.  Patient given informed consent, signed copy in the chart, time out was performed.  Placed in lithotomy position. Cervix viewed with speculum and colposcope after application of acetic acid.   Colposcopy adequate?  no Acetowhite lesions?no acetowhite lesions but previously noted vascular lesions seen at 12, 3,6 and 9 oclock Punctation?no Mosaicism?  no Abnormal vasculature?  Yes as above Biopsies?yes at 9 oclock ECC?yes  COMMENTS: Patient was given post procedure instructions.  Pathology will determine treatment.  Warden Fillers, MD  Attending OB/GYN Center for [facility]

## 2020-07-02 LAB — SURGICAL PATHOLOGY

## 2020-10-08 ENCOUNTER — Other Ambulatory Visit: Payer: Self-pay | Admitting: Internal Medicine

## 2020-10-09 LAB — EXTRA LAV TOP TUBE

## 2020-10-09 LAB — LIPID PANEL
Cholesterol: 166 mg/dL (ref <200)
HDL: 46 mg/dL — ABNORMAL LOW (ref >=50)
LDL Cholesterol (Calc): 89 mg/dL (calc)
Non-HDL Cholesterol (Calc): 120 mg/dL (calc) (ref <130)
Total CHOL/HDL Ratio: 3.6 (calc) (ref <5.0)
Triglycerides: 212 mg/dL — ABNORMAL HIGH (ref <150)

## 2020-10-09 LAB — THYROID PANEL WITH TSH
Free Thyroxine Index: 2.4 (ref 1.4–3.8)
T3 Uptake: 26 % (ref 22–35)
T4, Total: 9.4 ug/dL (ref 5.1–11.9)
TSH: <0.01 mIU/L — ABNORMAL LOW

## 2021-01-26 ENCOUNTER — Other Ambulatory Visit: Payer: Self-pay | Admitting: Internal Medicine

## 2021-01-26 DIAGNOSIS — Z1231 Encounter for screening mammogram for malignant neoplasm of breast: Secondary | ICD-10-CM

## 2021-03-19 ENCOUNTER — Encounter: Payer: Self-pay | Admitting: Obstetrics

## 2021-03-19 ENCOUNTER — Ambulatory Visit (INDEPENDENT_AMBULATORY_CARE_PROVIDER_SITE_OTHER): Payer: BC Managed Care – PPO | Admitting: Obstetrics

## 2021-03-19 ENCOUNTER — Other Ambulatory Visit (HOSPITAL_COMMUNITY)
Admission: RE | Admit: 2021-03-19 | Discharge: 2021-03-19 | Disposition: A | Discharge status: Home / Self Care | Payer: BC Managed Care – PPO | Source: Ambulatory Visit | Attending: Obstetrics | Admitting: Obstetrics

## 2021-03-19 ENCOUNTER — Other Ambulatory Visit: Payer: Self-pay

## 2021-03-19 VITALS — BP 137/81 | HR 73 | Ht 65.0 in | Wt 190.7 lb

## 2021-03-19 DIAGNOSIS — Z01419 Encounter for gynecological examination (general) (routine) without abnormal findings: Secondary | ICD-10-CM | POA: Diagnosis present

## 2021-03-19 DIAGNOSIS — Z01411 Encounter for gynecological examination (general) (routine) with abnormal findings: Secondary | ICD-10-CM | POA: Diagnosis not present

## 2021-03-19 DIAGNOSIS — Z1151 Encounter for screening for human papillomavirus (HPV): Secondary | ICD-10-CM | POA: Insufficient documentation

## 2021-03-19 DIAGNOSIS — N898 Other specified noninflammatory disorders of vagina: Secondary | ICD-10-CM | POA: Insufficient documentation

## 2021-03-19 DIAGNOSIS — E66811 Obesity, class 1: Secondary | ICD-10-CM

## 2021-03-19 DIAGNOSIS — E669 Obesity, unspecified: Secondary | ICD-10-CM

## 2021-03-19 DIAGNOSIS — R87610 Atypical squamous cells of undetermined significance on cytologic smear of cervix (ASC-US): Secondary | ICD-10-CM | POA: Diagnosis not present

## 2021-03-19 DIAGNOSIS — Z113 Encounter for screening for infections with a predominantly sexual mode of transmission: Secondary | ICD-10-CM | POA: Diagnosis not present

## 2021-03-19 NOTE — Progress Notes (Signed)
? ?Subjective: ? ? ?  ?  ? Chelsea Colon is a 51 y.o. female here for a routine exam.  Current complaints: Vaginal discharge.   ? ?Personal health questionnaire:  ?Is patient [name], have a family history of breast and/or ovarian cancer: yes ?Is there a family history of uterine cancer diagnosed at age < 1150, gastrointestinal cancer, urinary tract cancer, family member who is a Personnel officerLynch syndrome-associated carrier: no ?Is the patient overweight and hypertensive, family history of diabetes, personal history of gestational diabetes, preeclampsia or PCOS: no ?Is patient over 7555, have PCOS,  family history of premature CHD under age 51, diabetes, smoke, have hypertension or peripheral artery disease:  no ?At any time, has a partner hit, kicked or otherwise hurt or frightened you?: no ?Over the past 2 weeks, have you felt down, depressed or hopeless?: no ?Over the past 2 weeks, have you felt little interest or pleasure in doing things?:no ? ? ?Gynecologic History ?Patient's last menstrual period was 02/17/2021 (exact date). ?Contraception: tubal ligation ?Last Pap: 03-19-2020. Results were: abnormal ?Last mammogram: 06-18-2020. Results were: normal ? ?Obstetric History ?OB History  ?Gravida Para Term Preterm AB Living  ?5 5 5     5   ?SAB IAB Ectopic Multiple Live Births  ?      0 5  ?  ?# Outcome Date GA Lbr Len/2nd Weight Sex Delivery Anes PTL Lv  ?5 Term 11/23/14 7425w1d 14:45 / 00:58 7 lb 2.8 oz (3.255 kg) M VBAC EPI  LIV  ?4 Term 2008 1313w0d  6 lb 8 oz (2.948 kg) F Vag-Spont   LIV  ?3 Term 2004   7 lb 1 oz (3.204 kg) F Vag-Spont   LIV  ?2 Term 1994 8513w0d  5 lb 14 oz (2.665 kg) F Vag-Spont   LIV  ?1 Term 1991   5 lb 4 oz (2.381 kg) F CS-LTranv   LIV  ? ? ?Past Medical History:  ?Diagnosis Date  ? HPV (human papilloma virus) infection   ? Hx of chlamydia infection 2004  ? Treated  ? Hypertension   ? Chronic  ? Hyperthyroidism   ? Tubal ligation status 11/23/2014  ? Vaginal Pap smear, abnormal   ?  ?Past Surgical  History:  ?Procedure Laterality Date  ? ABDOMINAL SURGERY    ? CESAREAN SECTION    ? GYNECOLOGIC CRYOSURGERY    ? HERNIA REPAIR    ? TUBAL LIGATION Bilateral 11/23/2014  ? Procedure: POST PARTUM TUBAL LIGATION;  Surgeon: Kathreen CosierBernard A Marshall, MD;  Location: [facility];  Service: Gynecology;  Laterality: Bilateral;  ?  ? ?Current Outpatient Medications:  ?  cetirizine (ZYRTEC) 5 MG tablet, Take 5 mg by mouth daily., Disp: , Rfl:  ?  FeFum-FePoly-FA-B Cmp-C-Biot (INTEGRA PLUS) CAPS, Take 1 capsule by mouth daily., Disp: , Rfl:  ?  hydrochlorothiazide (HYDRODIURIL) 25 MG tablet, Take 25 mg by mouth daily., Disp: , Rfl:  ?  ibuprofen (ADVIL) 800 MG tablet, TAKE 1 TABLET BY MOUTH EVERY 8 HOURS AS NEEDED, Disp: 30 tablet, Rfl: 5 ?  LINZESS 145 MCG CAPS capsule, TAKE 1 CAPSULE BY MOUTH DAILY IN THE MORNING, Disp: , Rfl:  ?  methimazole (TAPAZOLE) 10 MG tablet, Take 10 mg by mouth 3 (three) times daily., Disp: , Rfl:  ?  potassium chloride (KLOR-CON) 20 MEQ packet, Take 20 mEq by mouth once., Disp: , Rfl:  ?  Vitamin D, Ergocalciferol, (DRISDOL) 1.25 MG (50000 UT) CAPS capsule, , Disp: , Rfl:  ?  diclofenac (VOLTAREN) 75 MG EC tablet, Take 75 mg by mouth 2 (two) times daily., Disp: , Rfl:  ?  esomeprazole (NEXIUM) 40 MG capsule, Take 40 mg by mouth 2 (two) times daily., Disp: , Rfl:  ?  propranolol (INDERAL) 10 MG tablet, Take 10 mg by mouth 2 (two) times daily., Disp: , Rfl:  ?  sucralfate (CARAFATE) 1 g tablet, Take 1 g by mouth 3 (three) times daily., Disp: , Rfl:  ?Allergies  ?Allergen Reactions  ? Lemon Extract [Flavoring Agent]   ?  Lips swell  ?  ?Social History  ? ?Tobacco Use  ? Smoking status: Never  ? Smokeless tobacco: Never  ?Substance Use Topics  ? Alcohol use: No  ?  ?Family History  ?Problem Relation Age of Onset  ? Diabetes Paternal Grandfather   ? Thyroid disease Maternal Grandmother   ? Hypertension Mother   ? Diabetes Mother   ? Heart disease Mother   ? High Cholesterol Mother   ? Asthma Daughter   ? Breast  cancer Paternal Aunt   ?  ? ? ?Review of Systems ? ?Constitutional: negative for fatigue and weight loss ?Respiratory: negative for cough and wheezing ?Cardiovascular: negative for chest pain, fatigue and palpitations ?Gastrointestinal: negative for abdominal pain and change in bowel habits ?Musculoskeletal:negative for myalgias ?Neurological: negative for gait problems and tremors ?Behavioral/Psych: negative for abusive relationship, depression ?Endocrine: negative for temperature intolerance    ?Genitourinary:negative for abnormal menstrual periods, genital lesions, hot flashes, sexual problems and vaginal discharge ?Integument/breast: negative for breast lump, breast tenderness, nipple discharge and skin lesion(s) ? ?  ?Objective:  ? ?    ?BP 137/81   Pulse 73   Ht 5\' 5"  (1.651 m)   Wt 190 lb 11.2 oz (86.5 kg)   LMP 02/17/2021 (Exact Date)   BMI 31.73 kg/m?  ?General:   alert  ?Skin:   no rash or abnormalities  ?Lungs:   clear to auscultation bilaterally  ?Heart:   regular rate and rhythm, S1, S2 normal, no murmur, click, rub or gallop  ?Breasts:   normal without suspicious masses, skin or nipple changes or axillary nodes  ?Abdomen:  normal findings: no organomegaly, soft, non-tender and no hernia  ?Pelvis:  External genitalia: normal general appearance ?Urinary system: urethral meatus normal and bladder without fullness, nontender ?Vaginal: normal without tenderness, induration or masses ?Cervix: normal appearance ?Adnexa: normal bimanual exam ?Uterus: anteverted and non-tender, normal size  ? ?Lab Review ?Urine pregnancy test ?Labs reviewed yes ?Radiologic studies reviewed yes ? ?I have spent a total of 20 minutes of face-to-face time, excluding clinical staff time, reviewing notes and preparing to see patient, ordering tests and/or medications, and counseling the patient.  ? ?Assessment:  ? ? 1. Encounter for gynecological examination with Papanicolaou smear of cervix ?Rx: ?- Cytology - PAP ? ?2. Vaginal  discharge ?Rx: ?- Cervicovaginal ancillary only( [place]) ? ?3. Screening for STD (sexually transmitted disease) ?Rx: ?- Hepatitis B surface antigen ?- Hepatitis C antibody ?- HIV Antibody (routine testing w rflx) ?- RPR ? ?4. Obesity (BMI 30.0-34.9) ?- weight reduction with the aid of dietary changes, exercise and behavioral modification recommended  ?  ?Plan:  ? ? Education reviewed: calcium supplements, depression evaluation, low fat, low cholesterol diet, safe sex/STD prevention, self breast exams, and healthy living. ?Follow up in: 1 year.  ? ? ?Orders Placed This Encounter  ?Procedures  ? Hepatitis B surface antigen  ? Hepatitis C antibody  ? HIV Antibody (routine testing  w rflx)  ? RPR  ? ? ? Brock Bad, MD ?03/19/2021 1:52 PM  ?

## 2021-03-19 NOTE — Progress Notes (Signed)
Annual/ GYN  ?No problems ?STI testing requested ?

## 2021-03-20 LAB — CERVICOVAGINAL ANCILLARY ONLY
Bacterial Vaginitis (gardnerella): NEGATIVE
Candida Glabrata: NEGATIVE
Candida Vaginitis: NEGATIVE
Chlamydia: NEGATIVE
Comment: NEGATIVE
Comment: NEGATIVE
Comment: NEGATIVE
Comment: NEGATIVE
Comment: NEGATIVE
Comment: NORMAL
Neisseria Gonorrhea: NEGATIVE
Trichomonas: NEGATIVE

## 2021-03-21 LAB — HEPATITIS C ANTIBODY: Hep C Virus Ab: NONREACTIVE

## 2021-03-21 LAB — RPR: RPR Ser Ql: NONREACTIVE

## 2021-03-21 LAB — HEPATITIS B SURFACE ANTIGEN: Hepatitis B Surface Ag: NEGATIVE

## 2021-03-21 LAB — HIV ANTIBODY (ROUTINE TESTING W REFLEX): HIV Screen 4th Generation wRfx: NONREACTIVE

## 2021-03-24 LAB — CYTOLOGY - PAP
Comment: NEGATIVE
Comment: NEGATIVE
Diagnosis: UNDETERMINED — AB
HPV 16: NEGATIVE
HPV 18 / 45: NEGATIVE
High risk HPV: POSITIVE — AB

## 2021-03-25 ENCOUNTER — Telehealth: Payer: Self-pay

## 2021-03-25 NOTE — Telephone Encounter (Signed)
S/w pt and advised of pap results and need for colpo. Pt states that results are the same every year, would like to discuss further with provider. Notified scheduler to make appt for virtual visit. ?

## 2021-03-26 ENCOUNTER — Telehealth (INDEPENDENT_AMBULATORY_CARE_PROVIDER_SITE_OTHER): Payer: BC Managed Care – PPO | Admitting: Obstetrics

## 2021-03-26 ENCOUNTER — Encounter: Payer: Self-pay | Admitting: Obstetrics

## 2021-03-26 DIAGNOSIS — R87610 Atypical squamous cells of undetermined significance on cytologic smear of cervix (ASC-US): Secondary | ICD-10-CM | POA: Diagnosis not present

## 2021-03-26 DIAGNOSIS — R87810 Cervical high risk human papillomavirus (HPV) DNA test positive: Secondary | ICD-10-CM | POA: Diagnosis not present

## 2021-03-26 NOTE — Progress Notes (Signed)
? ? ?GYNECOLOGY VIRTUAL VISIT ENCOUNTER NOTE ? ?Provider location: [facility] [place]  ? ?Patient location: Home ? ?I connected with [name] on 03/26/21 at  8:55 AM EDT by MyChart Video Encounter and verified that I am speaking with the correct person using two identifiers. ?  ?I discussed the limitations, risks, security and privacy concerns of performing an evaluation and management service virtually and the availability of in person appointments. I also discussed with the patient that there may be a patient responsible charge related to this service. The patient expressed understanding and agreed to proceed. ?  ?History:  ?Chelsea Colon is a 51 y.o. [id number] female being evaluated today for pap smear results.  She has a history of ASCUS with positive HRHPV, and colpo biopsies usually LGSL  She denies any abnormal vaginal discharge, bleeding, pelvic pain or other concerns.   ?  ?  ?Past Medical History:  ?Diagnosis Date  ? HPV (human papilloma virus) infection   ? Hx of chlamydia infection 2004  ? Treated  ? Hypertension   ? Chronic  ? Hyperthyroidism   ? Tubal ligation status 11/23/2014  ? Vaginal Pap smear, abnormal   ? ?Past Surgical History:  ?Procedure Laterality Date  ? ABDOMINAL SURGERY    ? CESAREAN SECTION    ? GYNECOLOGIC CRYOSURGERY    ? HERNIA REPAIR    ? TUBAL LIGATION Bilateral 11/23/2014  ? Procedure: POST PARTUM TUBAL LIGATION;  Surgeon: Kathreen Cosier, MD;  Location: [facility];  Service: Gynecology;  Laterality: Bilateral;  ? ?The following portions of the patient's history were reviewed and updated as appropriate: allergies, current medications, past family history, past medical history, past social history, past surgical history and problem list.  ? ?Health Maintenance:  ASCUS pap and positive HRHPV on 03-19-2021.  Normal mammogram on 06-18-2020.  ? ?Review of Systems:  ?Pertinent items noted in HPI and remainder of comprehensive ROS otherwise  negative. ? ?Physical Exam:  ? ?General:  Alert, oriented and cooperative. Patient appears to be in no acute distress.  ?Mental Status: Normal mood and affect. Normal behavior. Normal judgment and thought content.   ?Respiratory: Normal respiratory effort, no problems with respiration noted  ?Rest of physical exam deferred due to type of encounter ? ?Labs and Imaging ?Results for orders placed or performed in visit on 03/19/21 (from the past 336 hour(s))  ?Cytology - PAP  ? Collection Time: 03/19/21  1:42 PM  ?Result Value Ref Range  ? High risk HPV Positive (A)   ? HPV 16 Negative   ? HPV 18 / 45 Negative   ? Adequacy    ?  Satisfactory for evaluation; transformation zone component PRESENT.  ? Diagnosis (A)   ?  - Atypical squamous cells of undetermined significance (ASC-US)  ? Comment Normal Reference Range HPV - Negative   ? Comment Normal Reference Range HPV 16 18 45 -Negative   ?Cervicovaginal ancillary only( [place])  ? Collection Time: 03/19/21  1:42 PM  ?Result Value Ref Range  ? Neisseria Gonorrhea Negative   ? Chlamydia Negative   ? Trichomonas Negative   ? Bacterial Vaginitis (gardnerella) Negative   ? Candida Vaginitis Negative   ? Candida Glabrata Negative   ? Comment    ?  Normal Reference Range Bacterial Vaginosis - Negative  ? Comment Normal Reference Range Candida Species - Negative   ? Comment Normal Reference Range Candida Galbrata - Negative   ? Comment Normal Reference Range  Trichomonas - Negative   ? Comment Normal Reference [place] Chlamydia - Negative   ? Comment    ?  Normal Reference Range Neisseria Gonorrhea - Negative  ?Hepatitis B surface antigen  ? Collection Time: 03/19/21  1:45 PM  ?Result Value Ref Range  ? Hepatitis B Surface Ag Negative Negative  ?Hepatitis C antibody  ? Collection Time: 03/19/21  1:45 PM  ?Result Value Ref Range  ? Hep C Virus Ab Non Reactive Non Reactive  ?HIV Antibody (routine testing w rflx)  ? Collection Time: 03/19/21  1:45 PM  ?Result Value Ref Range  ?  HIV Screen 4th Generation wRfx Non Reactive Non Reactive  ?RPR  ? Collection Time: 03/19/21  1:45 PM  ?Result Value Ref Range  ? RPR Ser Ql Non Reactive Non Reactive  ? ?No results found.   ?  ?Assessment and Plan:  ?   ?1. Atypical squamous cell changes of undetermined significance (ASCUS) on cervical cytology with positive high risk human papilloma virus (HPV) ?- repeat pap in 1 year ? ?   ?  ?I discussed the assessment and treatment plan with the patient. The patient was provided an opportunity to ask questions and all were answered. The patient agreed with the plan and demonstrated an understanding of the instructions. ?  ?The patient was advised to call back or seek an in-person evaluation/go to the ED if the symptoms worsen or if the condition fails to improve as anticipated. ? ?I have spent a total of 15 minutes of non-face-to-face time, excluding clinical staff time, reviewing notes and preparing to see patient, ordering tests and/or medications, and counseling the patient.  ? ? ?Coral Ceo, MD ?[facility] ?03/26/21  ?

## 2021-03-26 NOTE — Progress Notes (Signed)
Pt being seen virtually for follow up after abnormal PAP. Pt states she has had HPV for several years and has abnormal PAP smears yearly. She has had to have several colposcopy's and wants to know what alternatives she has rather than doing a colposcopy after every abnormal PAP ?

## 2021-06-18 ENCOUNTER — Ambulatory Visit: Payer: BC Managed Care – PPO

## 2021-07-01 ENCOUNTER — Ambulatory Visit
Admission: RE | Admit: 2021-07-01 | Discharge: 2021-07-01 | Disposition: A | Discharge status: Home / Self Care | Payer: BC Managed Care – PPO | Source: Ambulatory Visit | Attending: Internal Medicine | Admitting: Internal Medicine

## 2021-07-01 DIAGNOSIS — Z1231 Encounter for screening mammogram for malignant neoplasm of breast: Secondary | ICD-10-CM

## 2022-02-23 ENCOUNTER — Other Ambulatory Visit: Payer: Self-pay | Admitting: Internal Medicine

## 2022-02-24 LAB — COMPLETE METABOLIC PANEL WITH GFR
AG Ratio: 1.4 (calc) (ref 1.0–2.5)
ALT: 22 U/L (ref 6–29)
AST: 28 U/L (ref 10–35)
Albumin: 4.2 g/dL (ref 3.6–5.1)
Alkaline phosphatase (APISO): 69 U/L (ref 37–153)
BUN: 11 mg/dL (ref 7–25)
CO2: 21 mmol/L (ref 20–32)
Calcium: 9 mg/dL (ref 8.6–10.4)
Chloride: 101 mmol/L (ref 98–110)
Creat: 0.74 mg/dL (ref 0.50–1.03)
Globulin: 3.1 g/dL (calc) (ref 1.9–3.7)
Glucose, Bld: 83 mg/dL (ref 65–99)
Potassium: 3.8 mmol/L (ref 3.5–5.3)
Sodium: 137 mmol/L (ref 135–146)
Total Bilirubin: 0.2 mg/dL (ref 0.2–1.2)
Total Protein: 7.3 g/dL (ref 6.1–8.1)
eGFR: 98 mL/min/{1.73_m2} (ref >=60)

## 2022-02-24 LAB — CBC
HCT: 37.2 % (ref 35.0–45.0)
Hemoglobin: 12.6 g/dL (ref 11.7–15.5)
MCH: 26.6 pg — ABNORMAL LOW (ref 27.0–33.0)
MCHC: 33.9 g/dL (ref 32.0–36.0)
MCV: 78.6 fL — ABNORMAL LOW (ref 80.0–100.0)
MPV: 10.3 fL (ref 7.5–12.5)
Platelets: 289 10*3/uL (ref 140–400)
RBC: 4.73 10*6/uL (ref 3.80–5.10)
RDW: 14.2 % (ref 11.0–15.0)
WBC: 7.3 10*3/uL (ref 3.8–10.8)

## 2022-02-24 LAB — LIPID PANEL
Cholesterol: 180 mg/dL (ref <200)
HDL: 48 mg/dL — ABNORMAL LOW (ref >=50)
LDL Cholesterol (Calc): 84 mg/dL (calc)
Non-HDL Cholesterol (Calc): 132 mg/dL (calc) — ABNORMAL HIGH (ref <130)
Total CHOL/HDL Ratio: 3.8 (calc) (ref <5.0)
Triglycerides: 362 mg/dL — ABNORMAL HIGH (ref <150)

## 2022-02-24 LAB — INFLUENZA A AND B AG, IMMUNOASSAY
INFLUENZA A ANTIGEN: NOT DETECTED
INFLUENZA B ANTIGEN: NOT DETECTED
MICRO NUMBER:: 14591305
SPECIMEN QUALITY:: ADEQUATE

## 2022-02-24 LAB — SARS-COV-2 RNA,(COVID-19) QUALITATIVE NAAT: SARS CoV2 RNA: DETECTED — AB

## 2022-02-24 LAB — VITAMIN D 25 HYDROXY (VIT D DEFICIENCY, FRACTURES): Vit D, 25-Hydroxy: 52 ng/mL (ref 30–100)

## 2022-03-30 ENCOUNTER — Other Ambulatory Visit (HOSPITAL_COMMUNITY)
Admission: RE | Admit: 2022-03-30 | Discharge: 2022-03-30 | Disposition: A | Discharge status: Home / Self Care | Payer: BC Managed Care – PPO | Source: Ambulatory Visit | Attending: Obstetrics | Admitting: Obstetrics

## 2022-03-30 ENCOUNTER — Ambulatory Visit (INDEPENDENT_AMBULATORY_CARE_PROVIDER_SITE_OTHER): Payer: BC Managed Care – PPO | Admitting: Obstetrics

## 2022-03-30 ENCOUNTER — Encounter: Payer: Self-pay | Admitting: Obstetrics

## 2022-03-30 VITALS — BP 136/85 | HR 62 | Ht 64.0 in | Wt 194.0 lb

## 2022-03-30 DIAGNOSIS — Z01419 Encounter for gynecological examination (general) (routine) without abnormal findings: Secondary | ICD-10-CM

## 2022-03-30 DIAGNOSIS — E669 Obesity, unspecified: Secondary | ICD-10-CM | POA: Diagnosis not present

## 2022-03-30 DIAGNOSIS — N951 Menopausal and female climacteric states: Secondary | ICD-10-CM

## 2022-03-30 DIAGNOSIS — Z1239 Encounter for other screening for malignant neoplasm of breast: Secondary | ICD-10-CM

## 2022-03-30 NOTE — Progress Notes (Signed)
Patient presents for AEX. Last Pap: 03/19/2021 Last Mammogram: 07/01/2021 Contraception/Cycles: Tubal in 2016, last period in December 2016 Vaginal/Urinary Symptoms: None STD Screen: Declines Other Concerns: None

## 2022-03-30 NOTE — Progress Notes (Signed)
Subjective:        Chelsea Colon is a 52 y.o. female here for a routine exam.  Current complaints: None  Personal health questionnaire:  Is patient [name], have a family history of breast and/or ovarian cancer: yes Is there a family history of uterine cancer diagnosed at age < 47, gastrointestinal cancer, urinary tract cancer, family member who is a Field seismologist syndrome-associated carrier: no Is the patient overweight and hypertensive, family history of diabetes, personal history of gestational diabetes, preeclampsia or PCOS: yes Is patient over 13, have PCOS,  family history of premature CHD under age 45, diabetes, smoke, have hypertension or peripheral artery disease:  no At any time, has a partner hit, kicked or otherwise hurt or frightened you?: no Over the past 2 weeks, have you felt down, depressed or hopeless?: no Over the past 2 weeks, have you felt little interest or pleasure in doing things?:no   Gynecologic History No LMP recorded. (Menstrual status: Perimenopausal). Contraception: tubal ligation Last Pap: 2023. Results were: ASCUS with positive High Risk HPV Last mammogram: 2023. Results were: normal  Obstetric History OB History  Gravida Para Term Preterm AB Living  5 5 5     5   SAB IAB Ectopic Multiple Live Births        0 5    # Outcome Date GA Lbr Len/2nd Weight Sex Delivery Anes PTL Lv  5 Term 11/23/14 [redacted]w[redacted]d 14:45 / 00:58 7 lb 2.8 oz (3.255 kg) M VBAC EPI  LIV  4 Term 2008 [redacted]w[redacted]d  6 lb 8 oz (2.948 kg) F Vag-Spont   LIV  3 Term 2004   7 lb 1 oz (3.204 kg) F Vag-Spont   LIV  2 Term 1994 [redacted]w[redacted]d  5 lb 14 oz (2.665 kg) F Vag-Spont   LIV  1 Term 1991   5 lb 4 oz (2.381 kg) F CS-LTranv   LIV    Past Medical History:  Diagnosis Date   HPV (human papilloma virus) infection    Hx of chlamydia infection 2004   Treated   Hypertension    Chronic   Hyperthyroidism    Tubal ligation status 11/23/2014   Vaginal Pap smear, abnormal     Past Surgical History:   Procedure Laterality Date   ABDOMINAL SURGERY     CESAREAN SECTION     GYNECOLOGIC CRYOSURGERY     HERNIA REPAIR     TUBAL LIGATION Bilateral 11/23/2014   Procedure: POST PARTUM TUBAL LIGATION;  Surgeon: Frederico Hamman, MD;  Location: [facility];  Service: Gynecology;  Laterality: Bilateral;     Current Outpatient Medications:    cetirizine (ZYRTEC) 5 MG tablet, Take 5 mg by mouth daily., Disp: , Rfl:    diclofenac (VOLTAREN) 75 MG EC tablet, Take 75 mg by mouth 2 (two) times daily., Disp: , Rfl:    hydrochlorothiazide (HYDRODIURIL) 25 MG tablet, Take 25 mg by mouth daily., Disp: , Rfl:    LINZESS 145 MCG CAPS capsule, TAKE 1 CAPSULE BY MOUTH DAILY IN THE MORNING, Disp: , Rfl:    methimazole (TAPAZOLE) 10 MG tablet, Take 10 mg by mouth 3 (three) times daily., Disp: , Rfl:    potassium chloride (KLOR-CON) 20 MEQ packet, Take 20 mEq by mouth once., Disp: , Rfl:    propranolol (INDERAL) 10 MG tablet, Take 10 mg by mouth 2 (two) times daily., Disp: , Rfl:    sucralfate (CARAFATE) 1 g tablet, Take 1 g by mouth 3 (three) times daily.,  Disp: , Rfl:    Vitamin D, Ergocalciferol, (DRISDOL) 1.25 MG (50000 UT) CAPS capsule, , Disp: , Rfl:    esomeprazole (NEXIUM) 40 MG capsule, Take 40 mg by mouth 2 (two) times daily. (Patient not taking: Reported on 03/26/2021), Disp: , Rfl:    FeFum-FePoly-FA-B Cmp-C-Biot (INTEGRA PLUS) CAPS, Take 1 capsule by mouth daily. (Patient not taking: Reported on 03/26/2021), Disp: , Rfl:    ibuprofen (ADVIL) 800 MG tablet, TAKE 1 TABLET BY MOUTH EVERY 8 HOURS AS NEEDED (Patient not taking: Reported on 03/26/2021), Disp: 30 tablet, Rfl: 5 Allergies  Allergen Reactions   Lemon Extract [Flavoring Agent]     Lips swell    Social History   Tobacco Use   Smoking status: Never   Smokeless tobacco: Never  Substance Use Topics   Alcohol use: No    Family History  Problem Relation Age of Onset   Diabetes Paternal Grandfather    Thyroid disease Maternal Grandmother     Hypertension Mother    Diabetes Mother    Heart disease Mother    High Cholesterol Mother    Asthma Daughter    Breast cancer Paternal Aunt       Review of Systems  Constitutional: negative for fatigue and weight loss Respiratory: negative for cough and wheezing Cardiovascular: negative for chest pain, fatigue and palpitations Gastrointestinal: negative for abdominal pain and change in bowel habits Musculoskeletal:negative for myalgias Neurological: negative for gait problems and tremors Behavioral/Psych: negative for abusive relationship, depression Endocrine: negative for temperature intolerance    Genitourinary:negative for abnormal menstrual periods, genital lesions, hot flashes, sexual problems and vaginal discharge Integument/breast: negative for breast lump, breast tenderness, nipple discharge and skin lesion(s)    Objective:       BP 136/85   Pulse 62   Ht 5\' 4"  (1.626 m)   Wt 194 lb (88 kg)   BMI 33.30 kg/m  General:   Alert and no distress  Skin:   no rash or abnormalities  Lungs:   clear to auscultation bilaterally  Heart:   regular rate and rhythm, S1, S2 normal, no murmur, click, rub or gallop  Breasts:   normal without suspicious masses, skin or nipple changes or axillary nodes  Abdomen:  normal findings: no organomegaly, soft, non-tender and no hernia  Pelvis:  External genitalia: normal general appearance Urinary system: urethral meatus normal and bladder without fullness, nontender Vaginal: normal without tenderness, induration or masses Cervix: normal appearance Adnexa: normal bimanual exam Uterus: anteverted and non-tender, normal size   Lab Review Urine pregnancy test Labs reviewed yes Radiologic studies reviewed yes  I have spent a total of 20 minutes of face-to-face time, excluding clinical staff time, reviewing notes and preparing to see patient, ordering tests and/or medications, and counseling the patient.   Assessment:     1. Encounter  for gynecological examination with Papanicolaou smear of cervix Rx: - Cytology - PAP( [place])  2. Perimenopause - doing well  3. Screening breast examination Rx: - MM DIGITAL SCREENING BILATERAL  4. Obesity (BMI 30.0-34.9) - weight reduction recommended    Plan:    Education reviewed: calcium supplements, depression evaluation, low fat, low cholesterol diet, safe sex/STD prevention, self breast exams, and weight bearing exercise. Follow up in: 1 year.    Orders Placed This Encounter  Procedures   MM DIGITAL SCREENING BILATERAL    Order Specific Question:   Reason for exam:    Answer:   Screening    Order Specific Question:  Is the patient pregnant?    Answer:   No    Order Specific Question:   Preferred imaging location?    Answer:   [place], [place] 03/30/2022 2:23 PM

## 2022-04-06 LAB — CYTOLOGY - PAP
Comment: NEGATIVE
Comment: NEGATIVE
Comment: NEGATIVE
Diagnosis: UNDETERMINED — AB
HPV 16: NEGATIVE
HPV 18 / 45: NEGATIVE
High risk HPV: POSITIVE — AB

## 2022-06-01 ENCOUNTER — Encounter: Payer: BC Managed Care – PPO | Admitting: Obstetrics and Gynecology

## 2022-07-05 ENCOUNTER — Ambulatory Visit
Admission: RE | Admit: 2022-07-05 | Discharge: 2022-07-05 | Disposition: A | Discharge status: Home / Self Care | Payer: BC Managed Care – PPO | Source: Ambulatory Visit | Attending: Obstetrics | Admitting: Obstetrics

## 2022-07-28 ENCOUNTER — Encounter: Payer: BC Managed Care – PPO | Admitting: Obstetrics and Gynecology

## 2022-09-28 ENCOUNTER — Encounter: Payer: BC Managed Care – PPO | Admitting: Obstetrics & Gynecology

## 2023-01-12 ENCOUNTER — Other Ambulatory Visit: Payer: Self-pay | Admitting: Internal Medicine

## 2023-01-12 DIAGNOSIS — E2839 Other primary ovarian failure: Secondary | ICD-10-CM

## 2023-03-01 ENCOUNTER — Ambulatory Visit: Payer: 59 | Admitting: Obstetrics

## 2023-04-19 ENCOUNTER — Ambulatory Visit: Payer: 59 | Admitting: Obstetrics

## 2023-05-11 ENCOUNTER — Other Ambulatory Visit (HOSPITAL_COMMUNITY)
Admission: RE | Admit: 2023-05-11 | Discharge: 2023-05-11 | Disposition: A | Discharge status: Home / Self Care | Source: Ambulatory Visit | Attending: Advanced Practice Midwife | Admitting: Advanced Practice Midwife

## 2023-05-11 ENCOUNTER — Encounter: Payer: Self-pay | Admitting: Advanced Practice Midwife

## 2023-05-11 ENCOUNTER — Ambulatory Visit: Admitting: Advanced Practice Midwife

## 2023-05-11 VITALS — BP 128/82 | HR 72 | Ht 65.0 in | Wt 196.8 lb

## 2023-05-11 DIAGNOSIS — Z6832 Body mass index (BMI) 32.0-32.9, adult: Secondary | ICD-10-CM | POA: Diagnosis not present

## 2023-05-11 DIAGNOSIS — Z01419 Encounter for gynecological examination (general) (routine) without abnormal findings: Secondary | ICD-10-CM

## 2023-05-11 DIAGNOSIS — R87610 Atypical squamous cells of undetermined significance on cytologic smear of cervix (ASC-US): Secondary | ICD-10-CM

## 2023-05-11 NOTE — Patient Instructions (Signed)
 [facility] [phone]  [address] [place]. [place], [place] [address]

## 2023-05-11 NOTE — Progress Notes (Signed)
 Subjective:     Chelsea Colon is a 53 y.o. female here at [facility] for a routine exam.  Current complaints: none.  Pt had abnormal Pap in 2024, but did not follow up with colpo. She had medical appointments for her son who has epilepsy and focused on her child and not herself.  She plans to follow up this year if more testing is needed after the Pap.  She was treated by her PCP for hot flashes with hormones, but then had bleeding x 1 month stopped the hormones. She would rather have hot flashes than bleeding and reports she has learned to manage them now.  Personal and family health history reviewed: yes.  Do you have a primary care provider? yes Do you feel safe at home? yes  Flowsheet Row Office Visit from 03/19/2021 in [facility] Total Score 0       Health Maintenance Due  Topic Date Due   DTaP/Tdap/Td (1 - Tdap) Never done   Colonoscopy  Never done   Zoster Vaccines- Shingrix (1 of 2) Never done   COVID-19 Vaccine (1 - 2024-25 season) Never done     Risk factors for chronic health problems: Smoking: Alchohol/how much: Pt BMI: Body mass index is 32.75 kg/m.   Gynecologic History No LMP recorded. (Menstrual status: Perimenopausal). Contraception: post menopausal status Last Pap: 03/30/22. Results were: abnormal with ASCUS and HPV + Last mammogram: 07/05/22. Results were: normal  Obstetric History OB History  Gravida Para Term Preterm AB Living  5 5 5   5   SAB IAB Ectopic Multiple Live Births     0 5    # Outcome Date GA Lbr Len/2nd Weight Sex Type Anes PTL Lv  5 Term 11/23/14 [redacted]w[redacted]d 14:45 / 00:58 7 lb 2.8 oz (3.255 kg) M VBAC EPI  LIV  4 Term 2008 [redacted]w[redacted]d  6 lb 8 oz (2.948 kg) F Vag-Spont   LIV  3 Term 2004   7 lb 1 oz (3.204 kg) F Vag-Spont   LIV  2 Term 1994 [redacted]w[redacted]d  5 lb 14 oz (2.665 kg) F Vag-Spont   LIV  1 Term 1991   5 lb 4 oz (2.381 kg) F CS-LTranv   LIV     The following portions of the patient's history were  reviewed and updated as appropriate: allergies, current medications, past family history, past medical history, past social history, past surgical history, and problem list.  Review of Systems Pertinent items noted in HPI and remainder of comprehensive ROS otherwise negative.    Objective:   Today's Vitals   05/11/23 1310  BP: 128/82  Pulse: 72  Weight: 196 lb 12.8 oz (89.3 kg)  Height: 5\' 5"  (1.651 m)   Body mass index is 32.75 kg/m.  VS reviewed, nursing note reviewed,  Constitutional: well developed, well nourished, no distress HEENT: normocephalic, thyroid  without enlargement or mass HEART: RRR, no murmurs rubs/gallops RESP: clear and equal to auscultation bilaterally in all lobes  Breast Exam:   exam performed: right breast normal without mass, skin or nipple changes or axillary nodes, left breast normal without mass, skin or nipple changes or axillary nodes Abdomen: soft Neuro: alert and oriented x 3 Skin: warm, dry Psych: affect normal Pelvic exam:  Performed: Cervix pink, visually closed, 3-4 vascular lesions as seen previously on colpo present, scant white creamy discharge, vaginal walls and external genitalia normal Bimanual exam: Cervix 0/long/high, firm, anterior, neg CMT, uterus  nontender, nonenlarged, adnexa without tenderness, enlargement, or mass        Assessment/Plan:   1. Encounter for annual routine gynecological examination   2. BMI 32.0-32.9,adult (Primary) --Pt desires weight loss, refer to [facility] weight [facility].    - Amb ref to Medical Nutrition Therapy-MNT  3. Atypical squamous cells of undetermined significance on cytologic smear of cervix (ASC-US ) --Several Paps in a row with ASCUs and HPV.  Multiple colposcopies and Cryo x 1 per pt.  Pt does not want to keep repeating colpo.   --Discussed options, possible procedure like LEEP vs estrogen cream and repeat Pap.  Will consult MD if Pap abnormal today.       Return in about 1 year (around  05/10/2024) for annual exam.   Arlester Bence, CNM 1:54 PM

## 2023-05-11 NOTE — Progress Notes (Signed)
 2 months ago was bleeding due to medication given by PCP for menopausal symptoms.    Would like to discuss weight loss.   Declined STD testing.

## 2023-05-13 LAB — CYTOLOGY - PAP
Comment: NEGATIVE
Diagnosis: NEGATIVE
High risk HPV: NEGATIVE

## 2023-05-16 ENCOUNTER — Telehealth: Payer: Self-pay

## 2023-05-16 NOTE — Telephone Encounter (Signed)
 Returned phone call. No answer. Left message with call back number to the office

## 2023-05-25 ENCOUNTER — Other Ambulatory Visit: Payer: Self-pay | Admitting: Internal Medicine

## 2023-05-25 DIAGNOSIS — Z1231 Encounter for screening mammogram for malignant neoplasm of breast: Secondary | ICD-10-CM

## 2023-06-07 ENCOUNTER — Ambulatory Visit

## 2023-07-06 ENCOUNTER — Ambulatory Visit

## 2023-07-19 ENCOUNTER — Ambulatory Visit

## 2023-08-02 ENCOUNTER — Ambulatory Visit
Admission: RE | Admit: 2023-08-02 | Discharge: 2023-08-02 | Disposition: A | Discharge status: Home / Self Care | Source: Ambulatory Visit | Attending: Internal Medicine | Admitting: Internal Medicine

## 2023-08-02 DIAGNOSIS — Z1231 Encounter for screening mammogram for malignant neoplasm of breast: Secondary | ICD-10-CM

## 2023-09-28 ENCOUNTER — Other Ambulatory Visit: Payer: Medicaid Other

## 2023-11-07 ENCOUNTER — Ambulatory Visit: Admitting: Podiatry

## 2023-11-16 ENCOUNTER — Encounter: Payer: Self-pay | Admitting: Podiatry

## 2023-11-16 ENCOUNTER — Ambulatory Visit: Admitting: Podiatry

## 2023-11-16 ENCOUNTER — Ambulatory Visit (INDEPENDENT_AMBULATORY_CARE_PROVIDER_SITE_OTHER)

## 2023-11-16 VITALS — Ht 65.0 in | Wt 196.8 lb

## 2023-11-16 DIAGNOSIS — L600 Ingrowing nail: Secondary | ICD-10-CM | POA: Diagnosis not present

## 2023-11-16 DIAGNOSIS — M7751 Other enthesopathy of right foot: Secondary | ICD-10-CM

## 2023-11-16 DIAGNOSIS — M7752 Other enthesopathy of left foot: Secondary | ICD-10-CM

## 2023-11-16 NOTE — Progress Notes (Signed)
   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    Subjective: Patient presents today for evaluation of pain to the lateral border of the bilateral great toes.  This has been ongoing for several years.  Exacerbated by narrow shoes.  Concerning for possible ingrown nail.  It is very sensitive to touch.  Patient presents today for further treatment and evaluation.  Past Medical History:  Diagnosis Date   HPV (human papilloma virus) infection    Hx of chlamydia infection 2004   Treated   Hypertension    Chronic   Hyperthyroidism    Tubal ligation status 11/23/2014   Vaginal Pap smear, abnormal     Past Surgical History:  Procedure Laterality Date   ABDOMINAL SURGERY     CESAREAN SECTION     GYNECOLOGIC CRYOSURGERY     HERNIA REPAIR     TUBAL LIGATION Bilateral 11/23/2014   Procedure: POST PARTUM TUBAL LIGATION;  Surgeon: Aida DELENA Na, MD;  Location: [facility];  Service: Gynecology;  Laterality: Bilateral;    Allergies  Allergen Reactions   Grass Pollen(K-O-R-T-Swt Vern)    Lemon Extract [Flavoring Agent (Non-Screening)]     Lips swell    Objective:  General: Well developed, nourished, in no acute distress, alert and oriented x3   Dermatology: Skin is warm, dry and supple bilateral.  Lateral border bilateral great toes is tender with evidence of an ingrowing nail. Pain on palpation noted to the border of the nail fold. The remaining nails appear unremarkable at this time.   Vascular: DP and PT pulses palpable.  No clinical evidence of vascular compromise  Neruologic: Grossly intact via light touch bilateral.  Musculoskeletal: No pedal deformity noted  Radiographic exam B/L feet 11/16/2023: Normal osseous mineralization.  Joint spaces  preserved.  Interphalangeal sesamoid noted to the great toe bilateral.  Assesement: #1 Paronychia with ingrowing nail lateral border bilateral great toes  Plan of Care:  -Patient evaluated.  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  I believe if we corrected her ingrown toenails today she would have a lot of pain and sensitivity.  She will come back to the office on a Friday in our [place] office for partial nail matricectomy so she has a weekend to recover -Planning to return to the [place] office 11/25/2023 for partial chemical nail matricectomy of the lateral border of the bilateral great toes  *Parole officer  Thresa EMERSON Sar, DPM [facility]  Dr. Thresa EMERSON Sar, DPM    [address] [place], [place] [address]                Office [phone]  Fax [phone]

## 2023-11-30 ENCOUNTER — Ambulatory Visit: Admitting: Podiatry

## 2023-11-30 ENCOUNTER — Encounter: Payer: Self-pay | Admitting: Podiatry

## 2023-11-30 VITALS — Ht 65.0 in | Wt 196.8 lb

## 2023-11-30 DIAGNOSIS — L600 Ingrowing nail: Secondary | ICD-10-CM | POA: Diagnosis not present

## 2023-11-30 NOTE — Patient Instructions (Signed)

## 2023-11-30 NOTE — Progress Notes (Signed)
   Chief Complaint  Patient presents with   Ingrown Toenail    Pt is here due to bilateral ingrown on both great toenails, would like them to be removed.    Subjective: Patient presents today for evaluation of pain to the lateral border bilateral great toes. Patient is concerned for possible ingrown nail.  It is very sensitive to touch.  Patient presents today for further treatment and evaluation.  Past Medical History:  Diagnosis Date   HPV (human papilloma virus) infection    Hx of chlamydia infection 2004   Treated   Hypertension    Chronic   Hyperthyroidism    Tubal ligation status 11/23/2014   Vaginal Pap smear, abnormal     Past Surgical History:  Procedure Laterality Date   ABDOMINAL SURGERY     CESAREAN SECTION     GYNECOLOGIC CRYOSURGERY     HERNIA REPAIR     TUBAL LIGATION Bilateral 11/23/2014   Procedure: POST PARTUM TUBAL LIGATION;  Surgeon: Aida DELENA Na, MD;  Location: [facility];  Service: Gynecology;  Laterality: Bilateral;    Allergies  Allergen Reactions   Grass Pollen(K-O-R-T-Swt Vern)    Lemon Extract [Flavoring Agent (Non-Screening)]     Lips swell    Objective:  General: Well developed, nourished, in no acute distress, alert and oriented x3   Dermatology: Skin is warm, dry and supple bilateral.  Lateral border bilateral great toes is tender with evidence of an ingrowing nail. Pain on palpation noted to the border of the nail fold. The remaining nails appear unremarkable at this time.   Vascular: DP and PT pulses palpable.  No clinical evidence of vascular compromise  Neruologic: Grossly intact via light touch bilateral.  Musculoskeletal: No pedal deformity noted  Assesement: #1 Paronychia with ingrowing nail lateral border bilateral great toes  Plan of Care:  -Patient evaluated.  -Discussed treatment alternatives and plan of care. Explained nail avulsion procedure and post procedure course to patient. -Patient opted for permanent partial  nail avulsion of the ingrown portion of the nail.  -Prior to procedure, local anesthesia infiltration utilized using 3 ml of a 50:50 mixture of 2% plain lidocaine  and 0.5% plain marcaine  in a normal hallux block fashion and a betadine prep performed.  -Partial permanent nail avulsion with chemical matrixectomy performed using 3x30sec applications of phenol followed by alcohol flush.  -Light dressing applied.  Post care instructions provided -Return to clinic 3 weeks  *Parole officer   Thresa EMERSON Sar, DPM [facility]  Dr. Thresa EMERSON Sar, DPM    [address] [place], [place] [address]                Office [phone]  Fax [phone]

## 2023-12-26 ENCOUNTER — Ambulatory Visit: Admitting: Podiatry
